# Patient Record
Sex: Female | Born: 1980 | Race: White | Hispanic: No | Marital: Married | Smoking: Former smoker
Health system: Southern US, Community
[De-identification: ages and names within clinical notes are randomized; demographics above are authoritative.]

## PROBLEM LIST (undated history)

## (undated) DIAGNOSIS — E079 Disorder of thyroid, unspecified: Secondary | ICD-10-CM

## (undated) DIAGNOSIS — Z8619 Personal history of other infectious and parasitic diseases: Secondary | ICD-10-CM

## (undated) DIAGNOSIS — K649 Unspecified hemorrhoids: Secondary | ICD-10-CM

## (undated) HISTORY — DX: Personal history of other infectious and parasitic diseases: Z86.19

## (undated) HISTORY — DX: Disorder of thyroid, unspecified: E07.9

## (undated) HISTORY — DX: Unspecified hemorrhoids: K64.9

---

## 2004-10-18 HISTORY — PX: LIPOMA EXCISION: SHX5283

## 2008-03-18 HISTORY — PX: THYROIDECTOMY: SHX17

## 2013-08-02 ENCOUNTER — Encounter: Payer: Self-pay | Admitting: Maternal & Fetal Medicine

## 2013-08-06 ENCOUNTER — Observation Stay: Payer: Self-pay | Admitting: Obstetrics & Gynecology

## 2013-08-06 LAB — URINALYSIS, COMPLETE
Bilirubin,UR: NEGATIVE
Blood: NEGATIVE
Glucose,UR: NEGATIVE mg/dL (ref 0–75)
Leukocyte Esterase: NEGATIVE
Ph: 8 (ref 4.5–8.0)
RBC,UR: NONE SEEN /HPF (ref 0–5)
Squamous Epithelial: 1
WBC UR: NONE SEEN /HPF (ref 0–5)

## 2013-08-19 ENCOUNTER — Encounter: Payer: Self-pay | Admitting: Obstetrics and Gynecology

## 2013-09-02 ENCOUNTER — Encounter: Payer: Self-pay | Admitting: Maternal & Fetal Medicine

## 2013-09-13 ENCOUNTER — Encounter: Payer: Self-pay | Admitting: Obstetrics and Gynecology

## 2013-09-30 ENCOUNTER — Encounter: Payer: Self-pay | Admitting: Obstetrics and Gynecology

## 2013-10-11 ENCOUNTER — Encounter: Payer: Self-pay | Admitting: Obstetrics and Gynecology

## 2013-10-20 ENCOUNTER — Observation Stay: Payer: Self-pay

## 2013-10-27 ENCOUNTER — Observation Stay: Payer: Self-pay

## 2013-10-27 LAB — FETAL FIBRONECTIN
Appearance: NORMAL
Fetal Fibronectin: NEGATIVE

## 2013-10-27 LAB — URINALYSIS, COMPLETE
Glucose,UR: NEGATIVE mg/dL (ref 0–75)
Ketone: NEGATIVE
Nitrite: NEGATIVE
Ph: 8 (ref 4.5–8.0)
Protein: NEGATIVE
WBC UR: <1 /HPF (ref 0–5)

## 2013-10-28 ENCOUNTER — Encounter: Payer: Self-pay | Admitting: Maternal & Fetal Medicine

## 2013-11-04 ENCOUNTER — Encounter: Payer: Self-pay | Admitting: Obstetrics and Gynecology

## 2013-11-08 ENCOUNTER — Encounter: Payer: Self-pay | Admitting: Obstetrics & Gynecology

## 2013-11-15 ENCOUNTER — Encounter: Payer: Self-pay | Admitting: Maternal & Fetal Medicine

## 2013-11-18 HISTORY — PX: TUBAL LIGATION: SHX77

## 2013-11-19 ENCOUNTER — Observation Stay: Payer: Self-pay | Admitting: Obstetrics and Gynecology

## 2013-11-22 ENCOUNTER — Encounter: Payer: Self-pay | Admitting: Obstetrics and Gynecology

## 2013-11-25 ENCOUNTER — Inpatient Hospital Stay: Payer: Self-pay | Admitting: Obstetrics and Gynecology

## 2013-11-25 LAB — CBC WITH DIFFERENTIAL/PLATELET
BASOS PCT: 0.3 %
Basophil #: 0 10*3/uL (ref 0.0–0.1)
EOS PCT: 0.3 %
Eosinophil #: 0 10*3/uL (ref 0.0–0.7)
HCT: 35.8 % (ref 35.0–47.0)
HGB: 12 g/dL (ref 12.0–16.0)
Lymphocyte #: 1 10*3/uL (ref 1.0–3.6)
Lymphocyte %: 12.4 %
MCH: 28.5 pg (ref 26.0–34.0)
MCHC: 33.5 g/dL (ref 32.0–36.0)
MCV: 85 fL (ref 80–100)
Monocyte #: 0.6 x10 3/mm (ref 0.2–0.9)
Monocyte %: 7.1 %
Neutrophil #: 6.4 10*3/uL (ref 1.4–6.5)
Neutrophil %: 79.9 %
Platelet: 152 10*3/uL (ref 150–440)
RBC: 4.22 10*6/uL (ref 3.80–5.20)
RDW: 15.1 % — ABNORMAL HIGH (ref 11.5–14.5)
WBC: 8 10*3/uL (ref 3.6–11.0)

## 2013-11-26 LAB — HEMATOCRIT: HCT: 35.2 % (ref 35.0–47.0)

## 2013-11-29 LAB — PATHOLOGY REPORT

## 2013-12-02 LAB — PATHOLOGY REPORT

## 2014-07-08 ENCOUNTER — Ambulatory Visit: Payer: Self-pay | Admitting: Internal Medicine

## 2014-08-24 IMAGING — US US OB FOLLOW-UP - NRPT [facility]
1 series · 14 of 28 positions shown · non-contrast
Comparison: none

[Series 1: us ob follow-up - nrpt [facility] · 0.30mm/px · 14 of 78 slices shown]
[im 3/78]
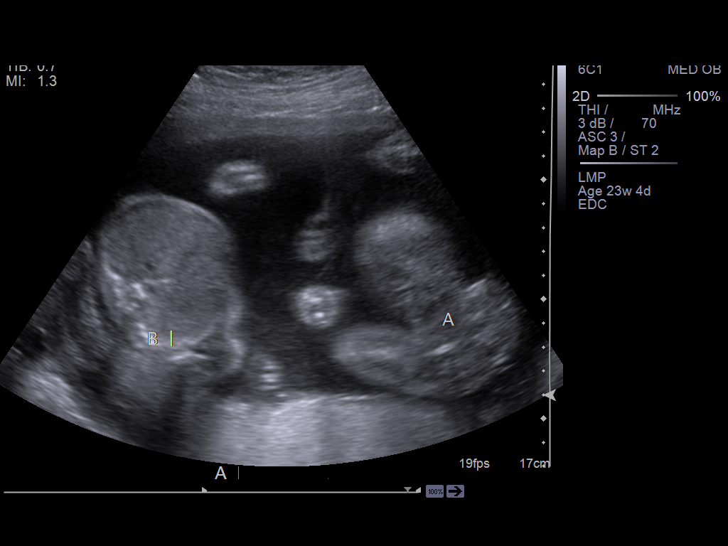
[im 9/78]
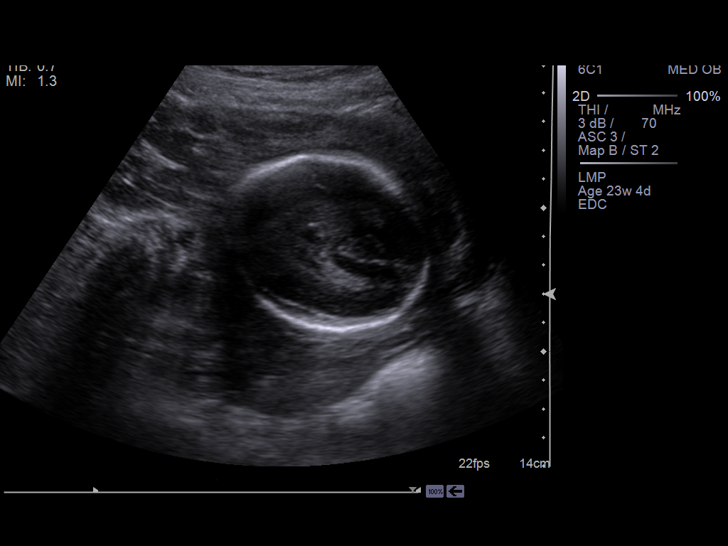
[im 15/78]
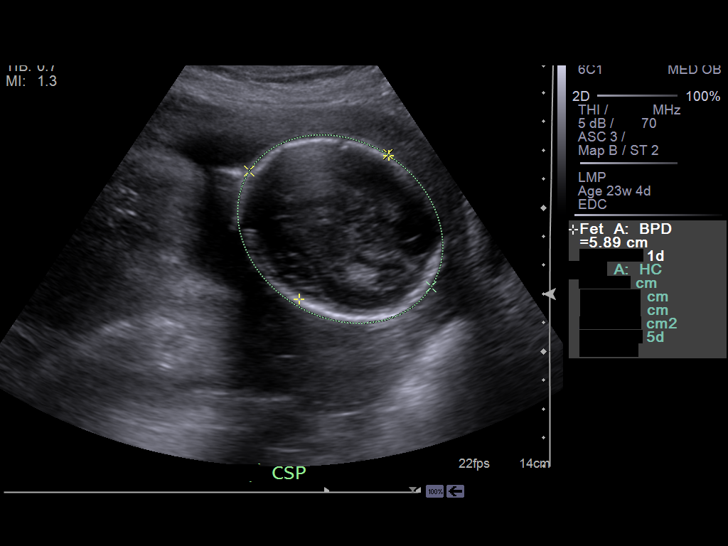
[im 20/78]
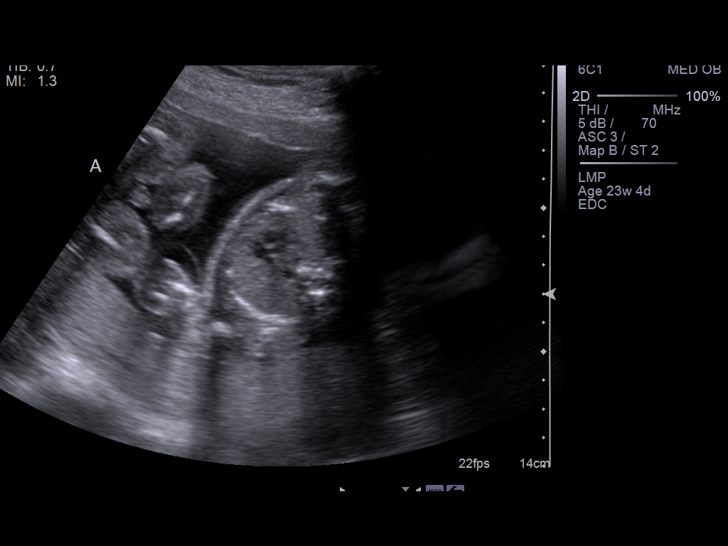
[im 26/78]
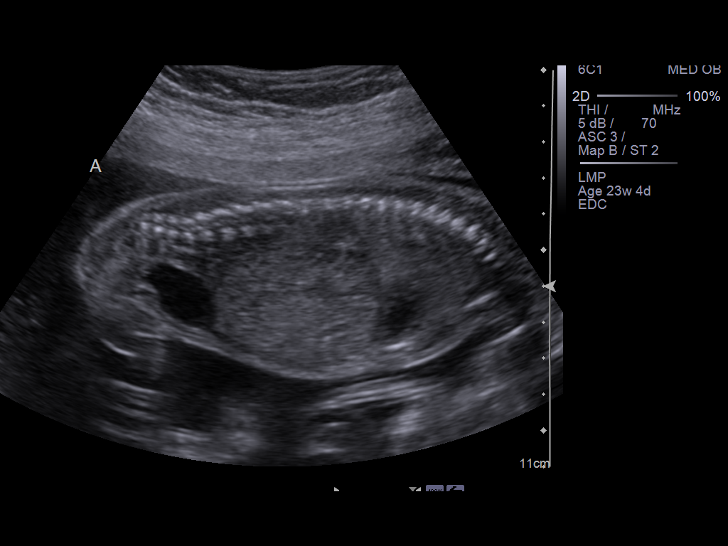
[im 32/78]
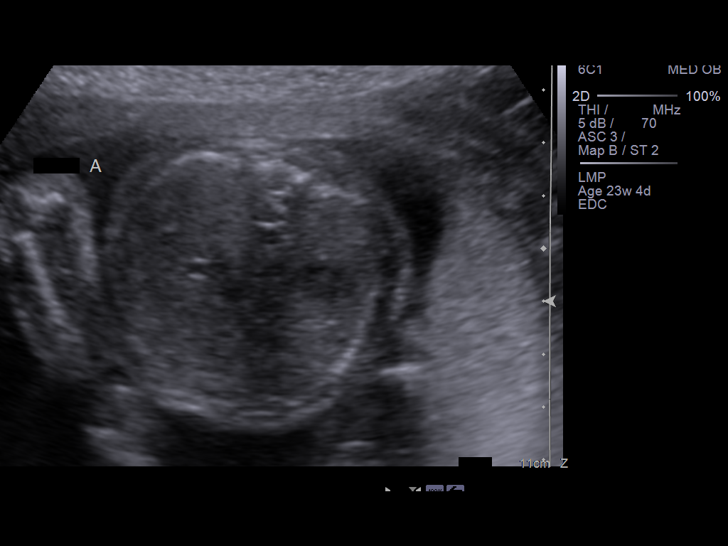
[im 38/78]
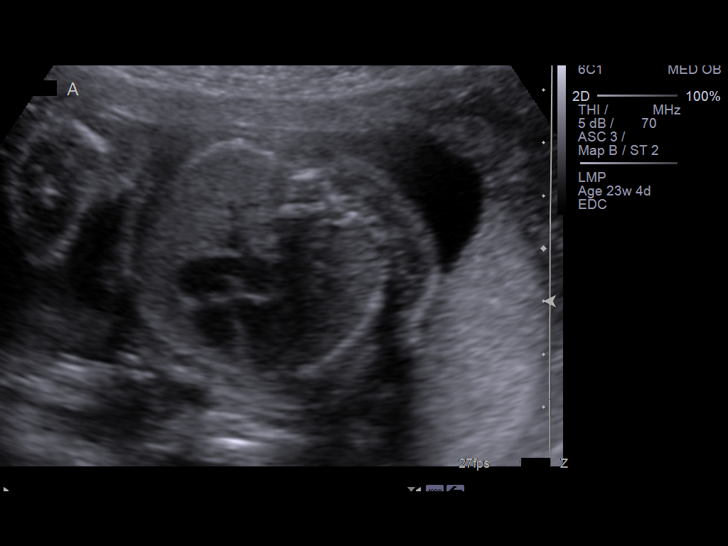
[im 43/78]
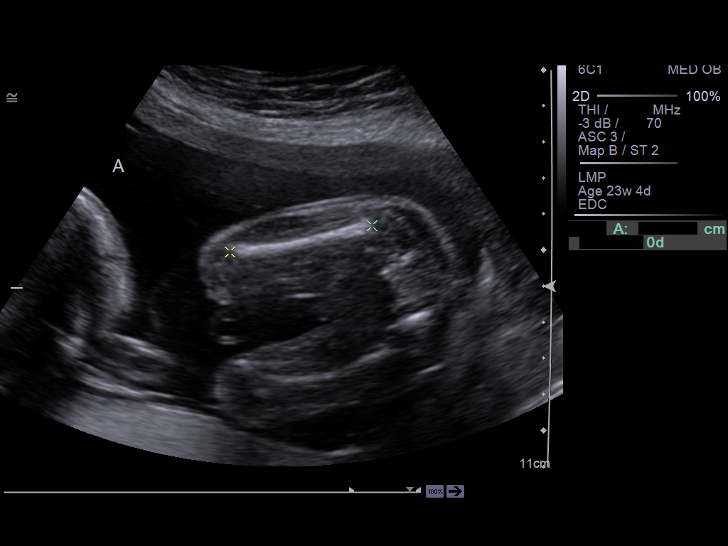
[im 49/78]
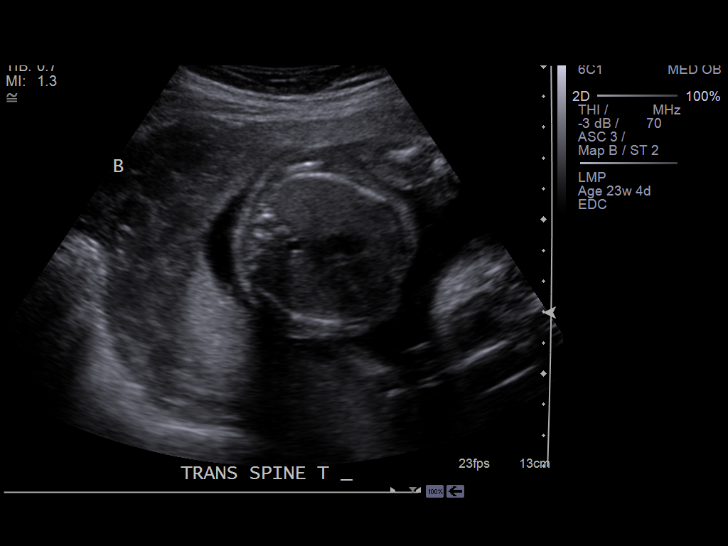
[im 55/78]
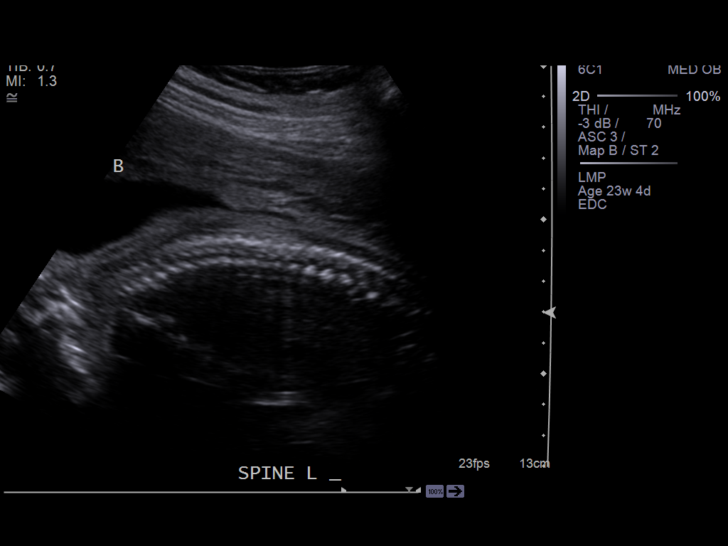
[im 60/78]
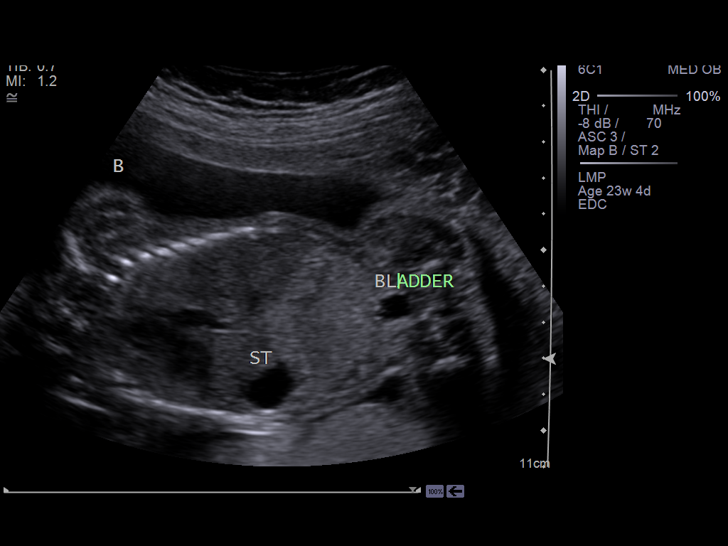
[im 66/78]
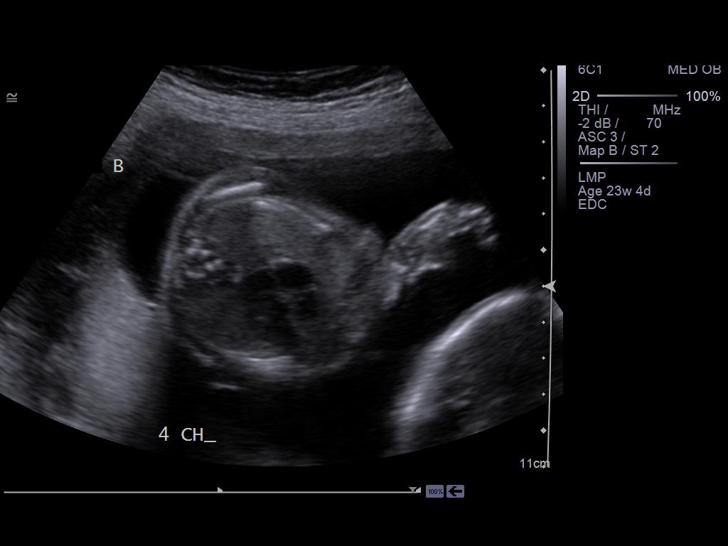
[im 72/78]
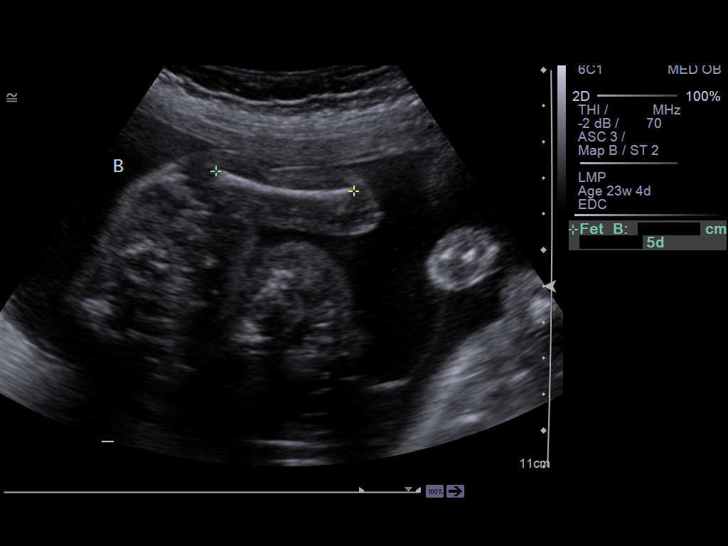
[im 78/78]
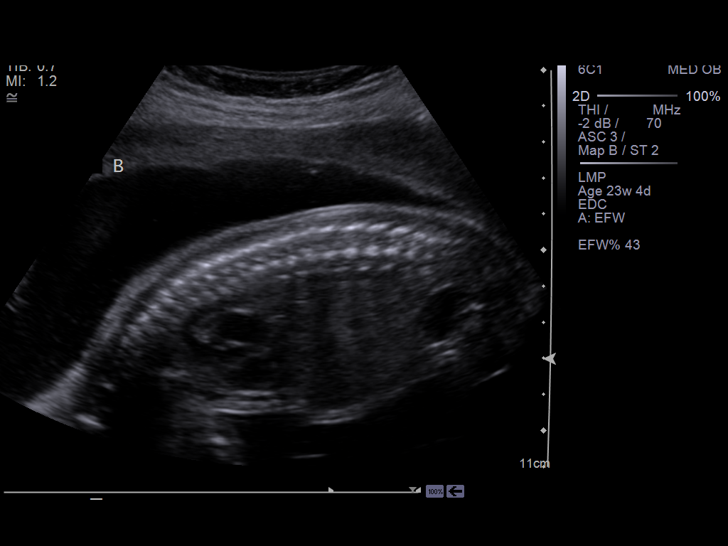

[14 of 28 positions shown; findings below may reference images not displayed]

IMAGES IMPORTED FROM THE SYNGO WORKFLOW SYSTEM
NO DICTATION FOR STUDY

## 2014-09-27 IMAGING — US US FETAL BPP W/O NON-STRESS - NRPT
1 series · 13 of 13 positions shown · non-contrast
Comparison: none

[Series 1: us fetal bpp w/o non-stress - nrpt · 0.31mm/px · 13 of 13 slices shown]
[im 1/13]
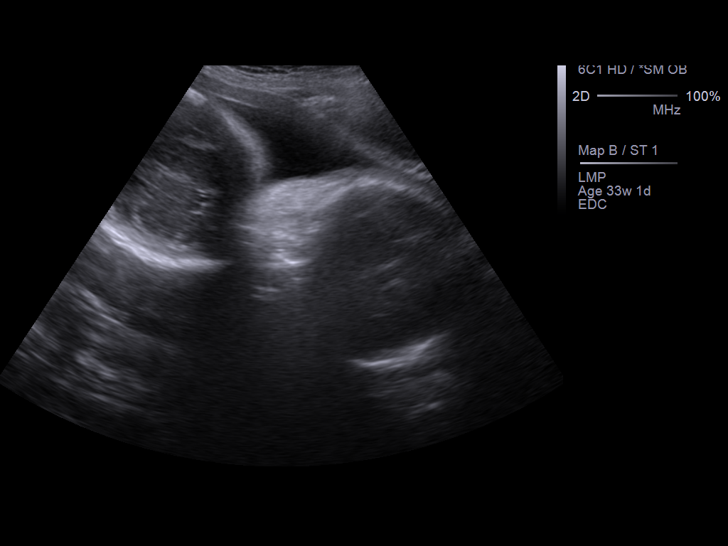
[im 2/13]
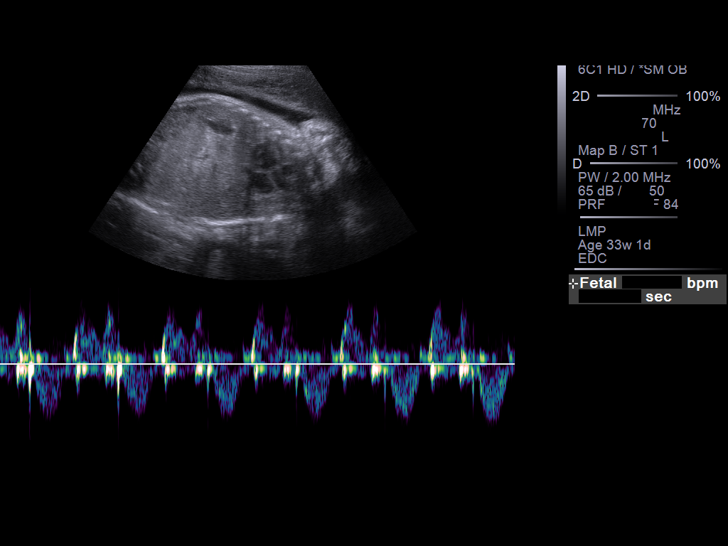
[im 3/13]
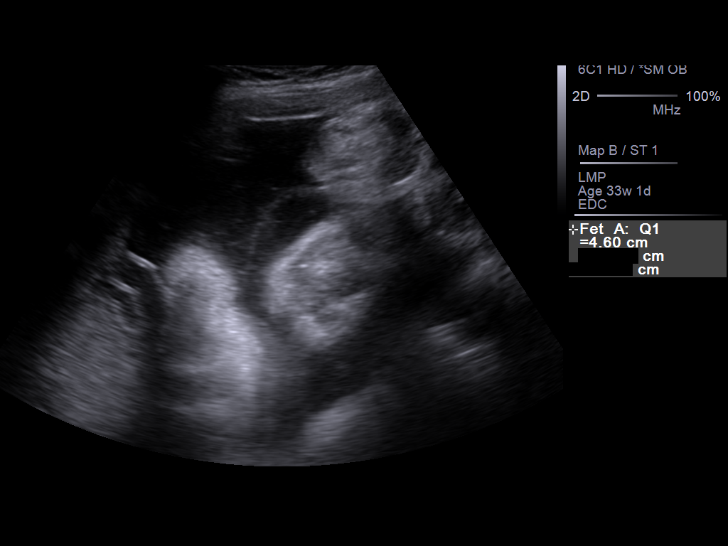
[im 4/13]
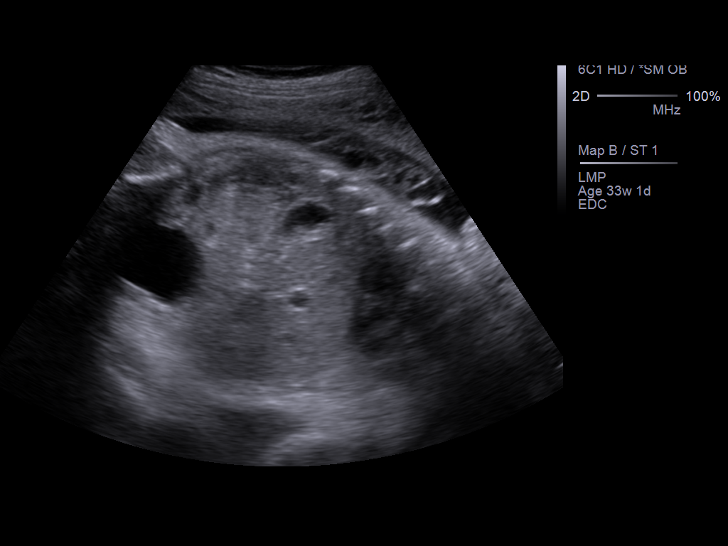
[im 5/13]
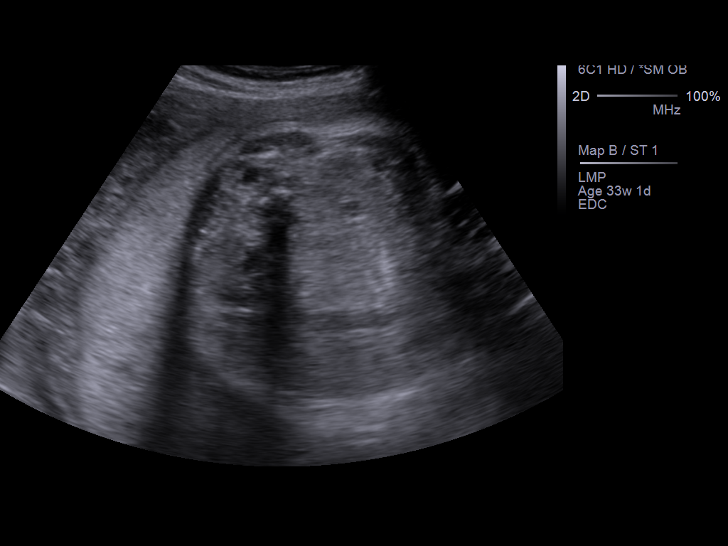
[im 6/13]
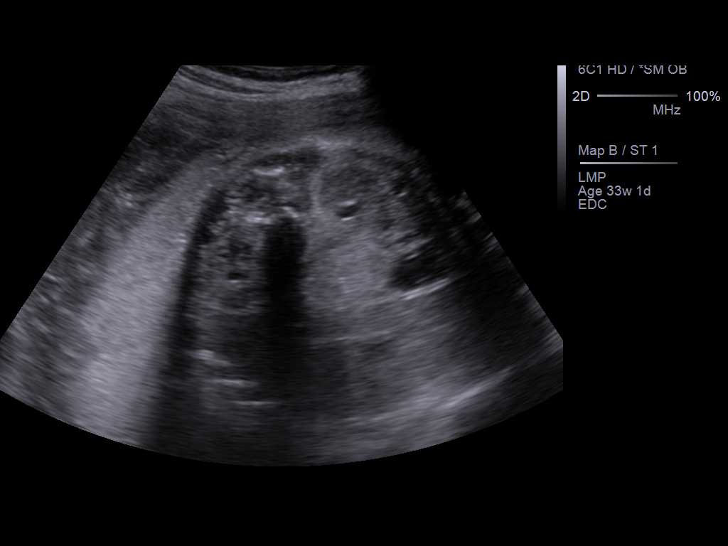
[im 7/13]
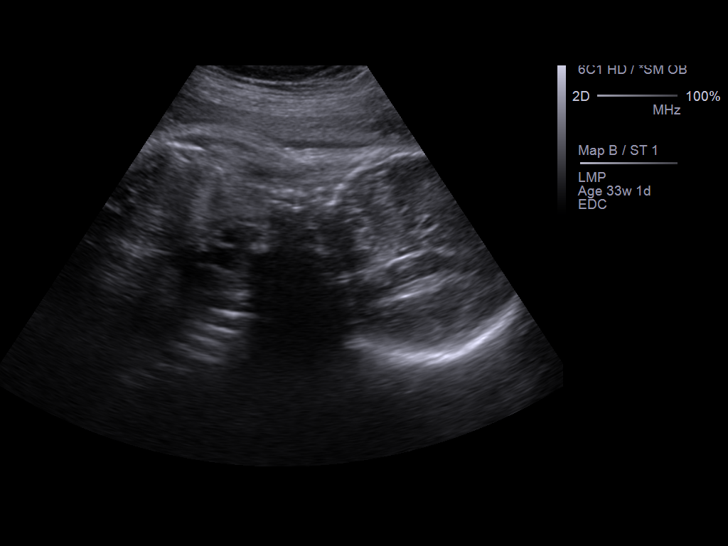
[im 8/13]
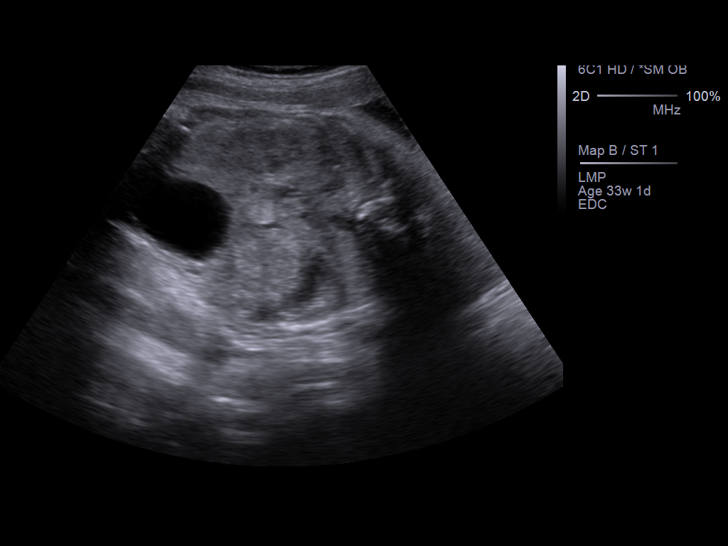
[im 9/13]
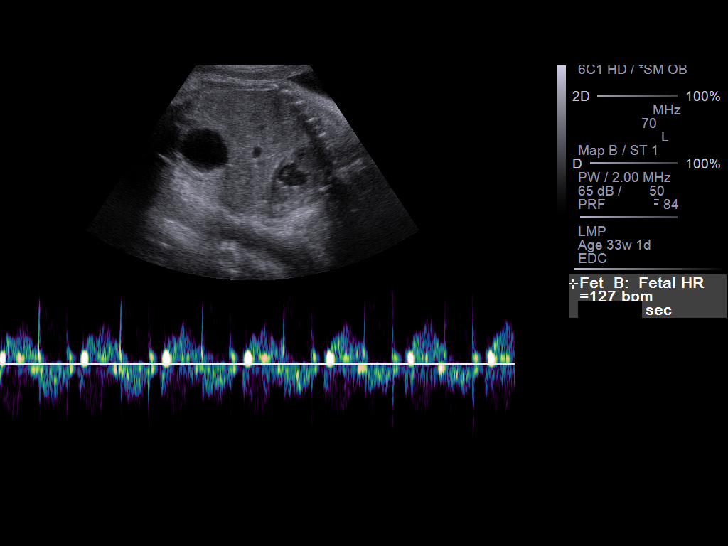
[im 10/13]
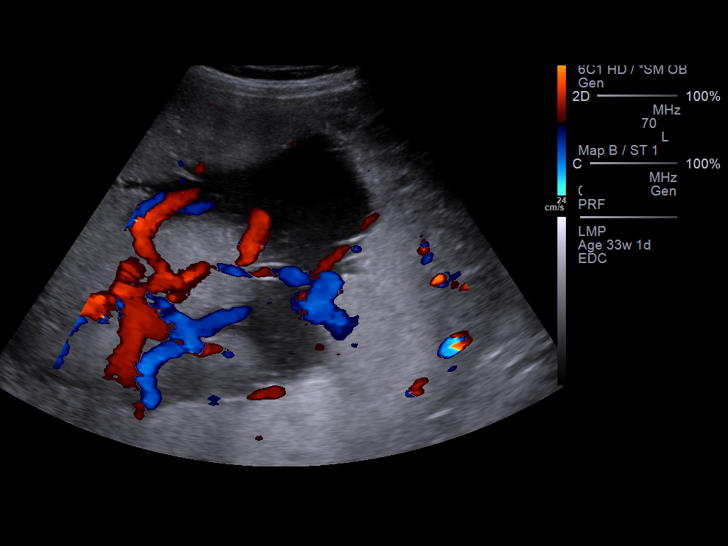
[im 11/13]
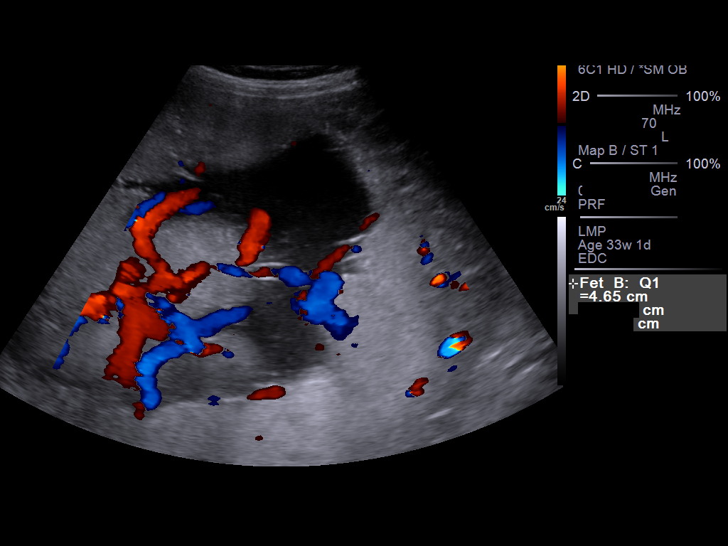
[im 12/13]
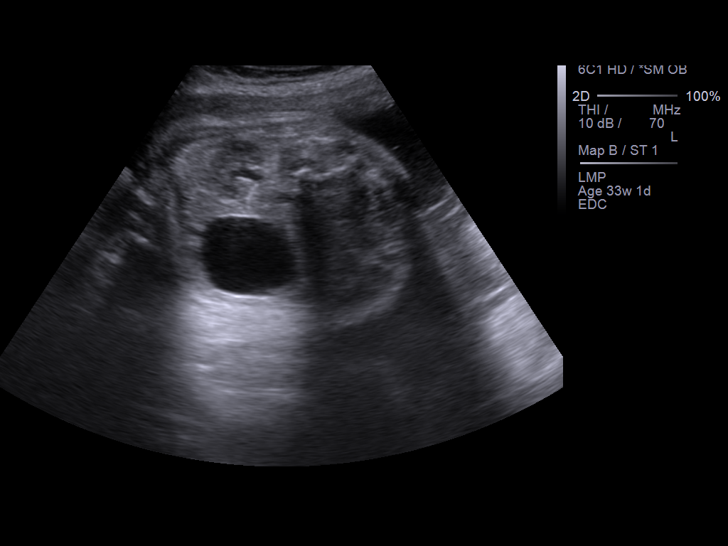
[im 13/13]
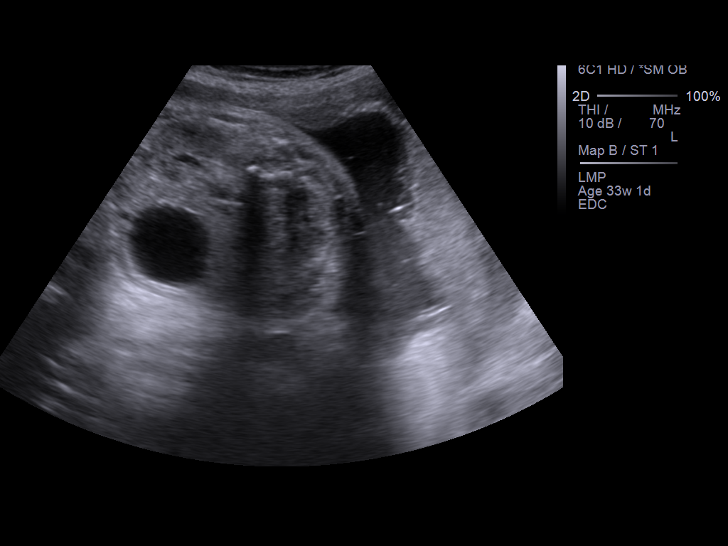

[13 of 13 positions shown; findings below may reference images not displayed]

IMAGES IMPORTED FROM THE SYNGO WORKFLOW SYSTEM
NO DICTATION FOR STUDY

## 2014-10-01 IMAGING — US US OB FOLLOW-UP - NRPT [facility]
1 series · 14 of 28 positions shown · non-contrast
Comparison: none

[Series 1: us ob follow-up - nrpt [facility] · 0.26mm/px · 14 of 91 slices shown]
[im 4/91]
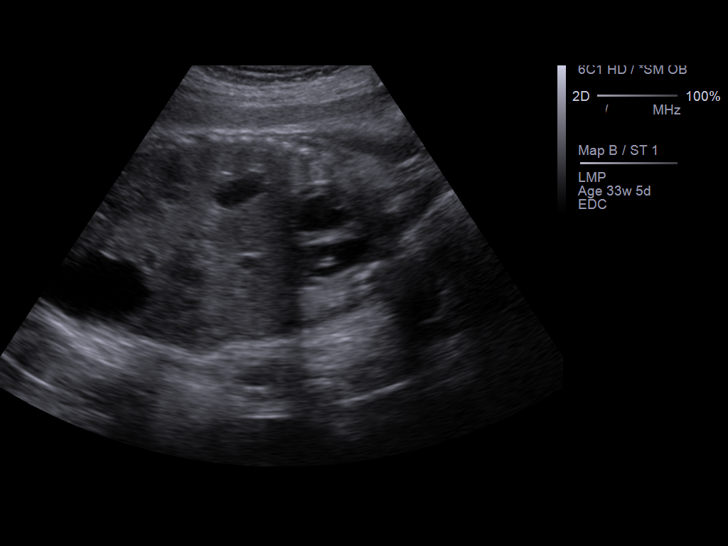
[im 11/91]
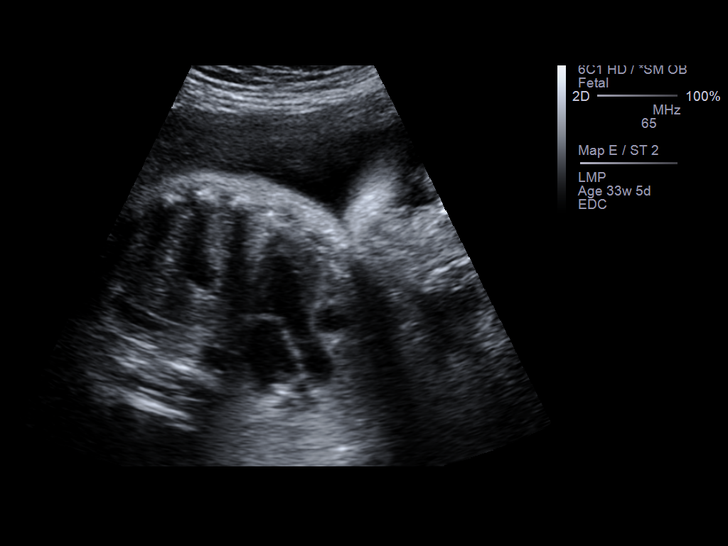
[im 17/91]
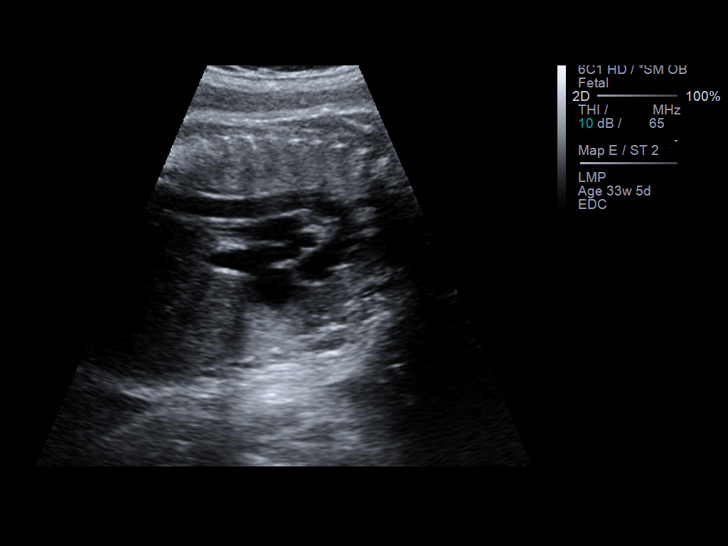
[im 24/91]
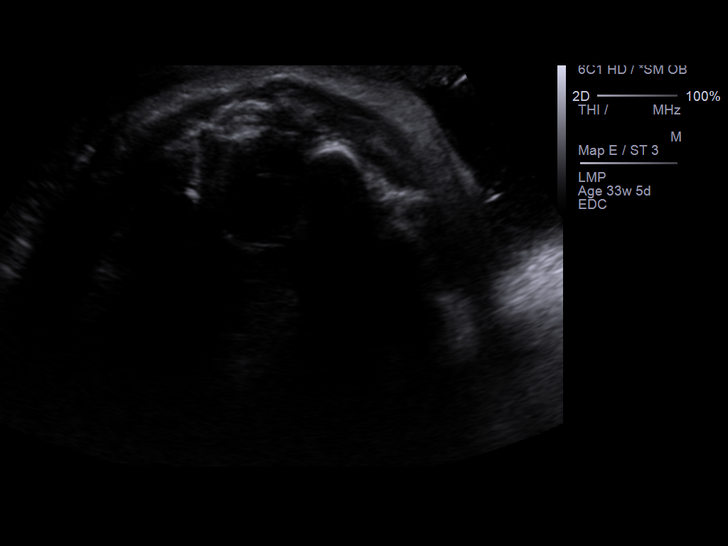
[im 31/91]
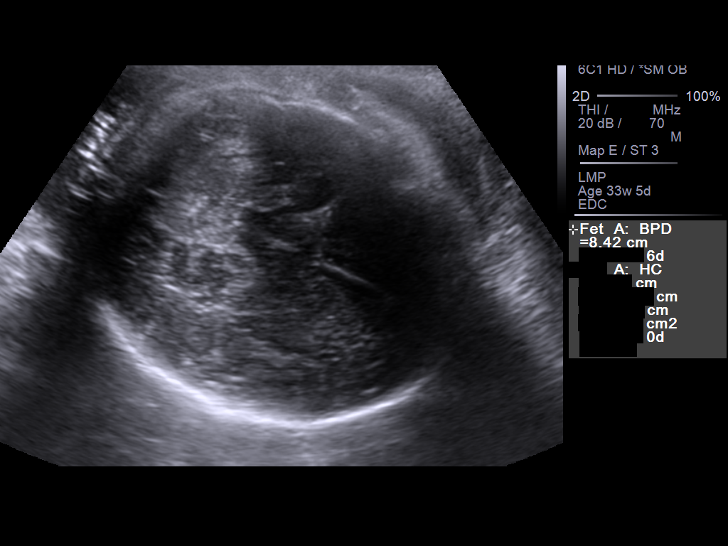
[im 37/91]
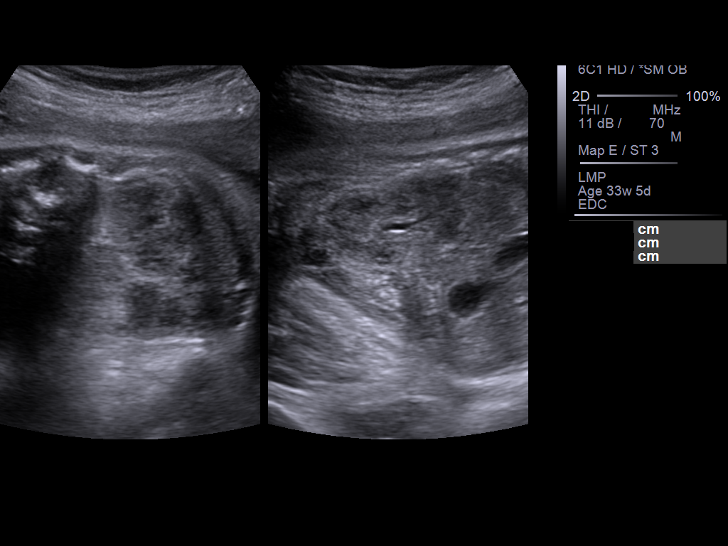
[im 44/91]
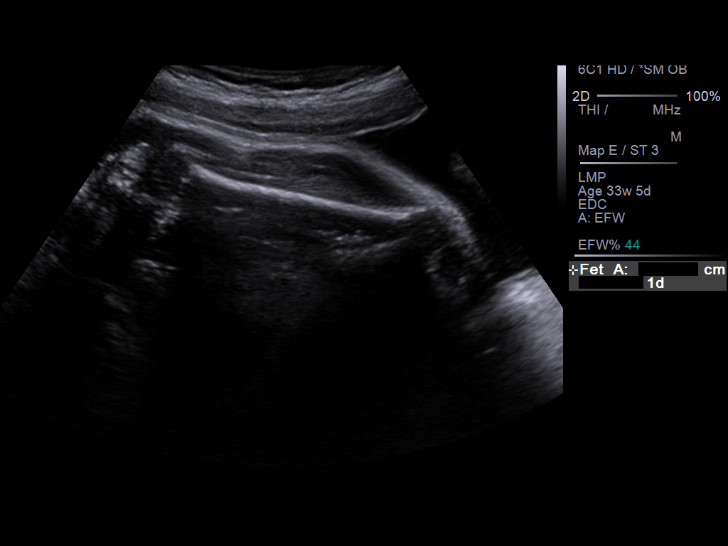
[im 51/91]
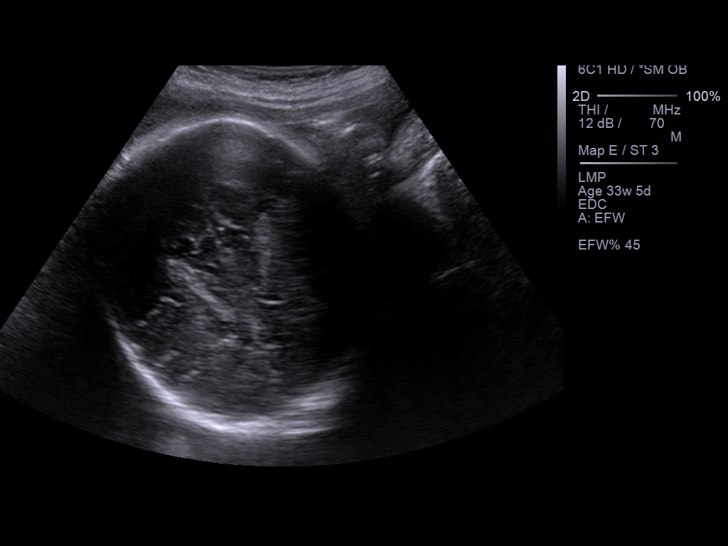
[im 57/91]
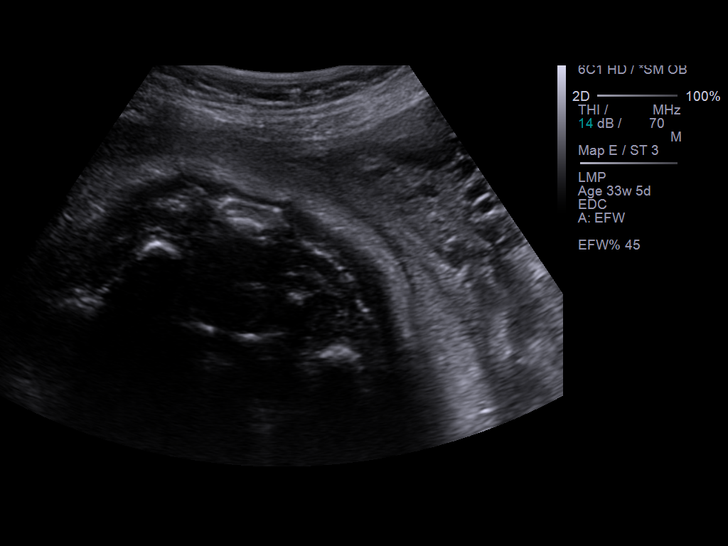
[im 64/91]
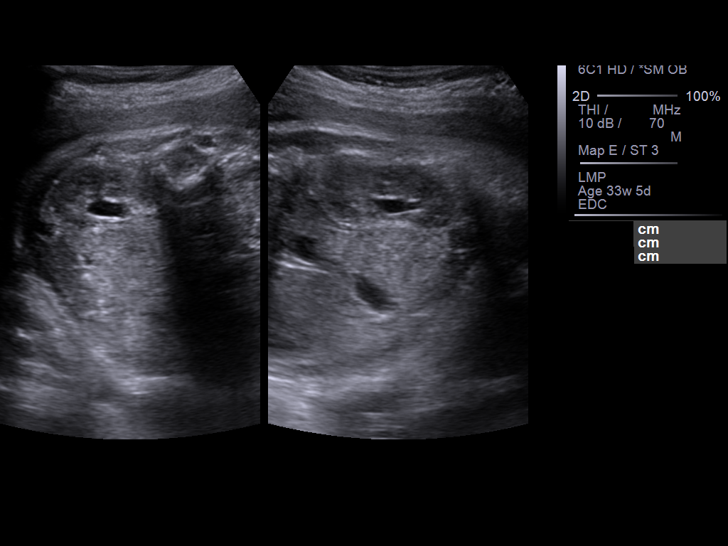
[im 71/91]
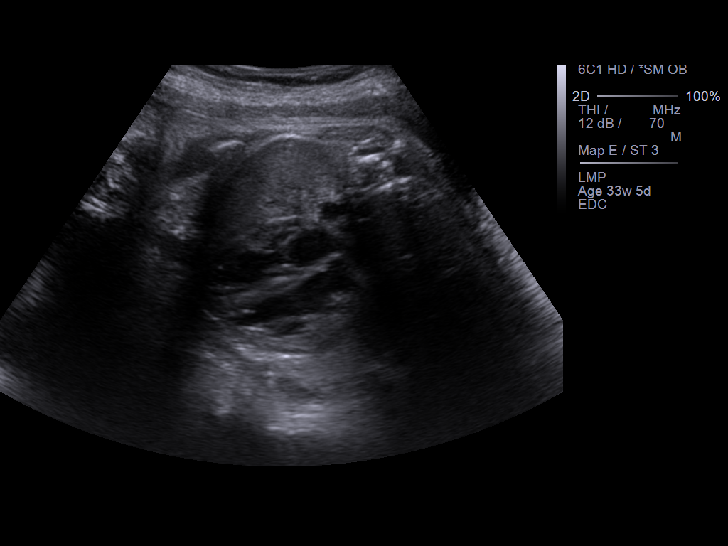
[im 77/91]
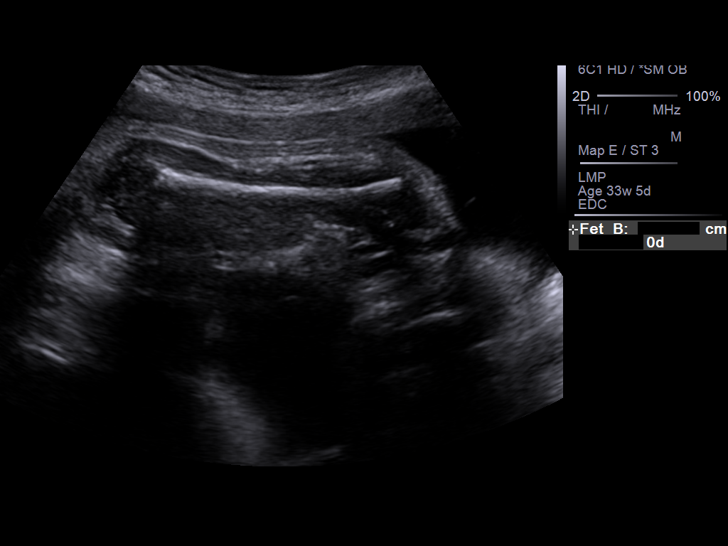
[im 84/91]
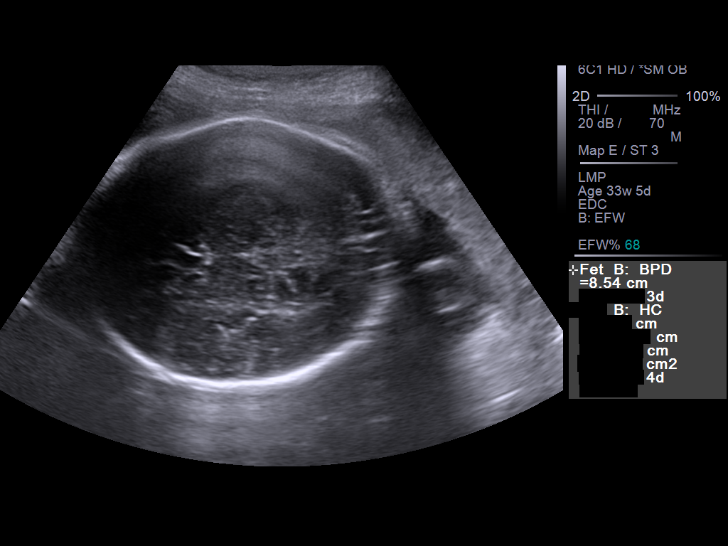
[im 91/91]
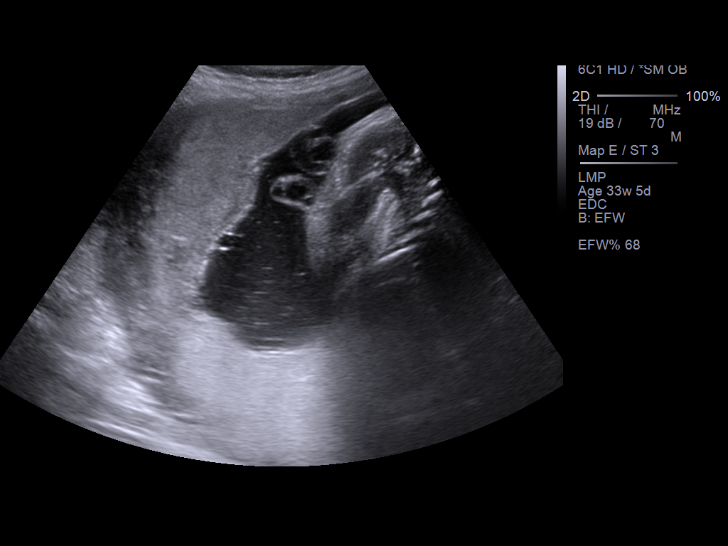

[14 of 28 positions shown; findings below may reference images not displayed]

IMAGES IMPORTED FROM THE SYNGO WORKFLOW SYSTEM
NO DICTATION FOR STUDY

## 2015-01-09 ENCOUNTER — Encounter: Payer: Self-pay | Admitting: *Deleted

## 2015-01-18 ENCOUNTER — Other Ambulatory Visit: Payer: 59

## 2015-01-18 ENCOUNTER — Encounter: Payer: Self-pay | Admitting: General Surgery

## 2015-01-18 ENCOUNTER — Ambulatory Visit (INDEPENDENT_AMBULATORY_CARE_PROVIDER_SITE_OTHER): Payer: 59 | Admitting: General Surgery

## 2015-01-18 VITALS — BP 110/74 | Ht 67.0 in | Wt 179.0 lb

## 2015-01-18 DIAGNOSIS — R1909 Other intra-abdominal and pelvic swelling, mass and lump: Secondary | ICD-10-CM

## 2015-01-18 NOTE — Patient Instructions (Addendum)
The patient is aware to call back for any questions or concerns. excision of the left groin nodule at same day surgery  Patient's surgery has been [facility].

## 2015-01-18 NOTE — Progress Notes (Signed)
Patient ID: Jaclyn Brandt, female   DOB: May 12, 1981, 34 y.o.   MRN: [id number]  Chief Complaint  Patient presents with  . Other    evaluation for left inguinal lymphadenopathy    HPI Jaclyn Brandt is a 34 y.o. female.  Here today for evaluation of left inguinal adenopathy. She states she noticed a knot in the left groin about two years ago (while living in [place]  She states the knot did go away for about 3-6 months (while pregnant with twins) but came back and has not gone away. She states it has gotten larger since early fall. She states the area is tender and "umcomfortable" worse with bending. She has a dog in the house but no cats. She denies any scratches or trauma to that area.  Children 8,7 and twins 8916 months old.  HPI  Past Medical History  Diagnosis Date  . Thyroid disease   . Hemorrhoids   . History of HPV infection     Past Surgical History  Procedure Laterality Date  . Lipoma excision Left 10/2004    left calf   . Thyroidectomy  03/2008  . Tubal ligation  11/2013    Family History  Problem Relation Age of Onset  . Cancer Paternal Aunt     great aunt/breast  . Cancer Maternal Grandmother     breast    Social History History  Substance Use Topics  . Smoking status: Former Smoker -- 5 years  . Smokeless tobacco: Never Used  . Alcohol Use: Yes    No Known Allergies  Current Outpatient Prescriptions  Medication Sig Dispense Refill  . levothyroxine (SYNTHROID, LEVOTHROID) 150 MCG tablet Take 150 mcg by mouth daily before breakfast.     No current facility-administered medications for this visit.    Review of Systems Review of Systems  Constitutional: Negative.   Respiratory: Negative.   Cardiovascular: Negative.   Gastrointestinal: Positive for constipation. Negative for diarrhea and blood in stool.    Blood pressure 110/74, height 5\' 7"  (1.702 m), weight 179 lb (81.194 kg), last menstrual period 01/08/2015.  Physical Exam Physical Exam   Constitutional: She appears well-developed and well-nourished.  Neck: Neck supple.  Cardiovascular: Normal rate, regular rhythm and normal heart sounds.   Pulses:      Femoral pulses are 2+ on the right side, and 2+ on the left side. No lower leg edema.  Pulmonary/Chest: Effort normal and breath sounds normal.  Abdominal: Soft. Normal appearance. There is no hepatomegaly. There is no tenderness.    Lymphadenopathy:    She has no cervical adenopathy.    She has no axillary adenopathy.       Left: Inguinal adenopathy present.  2.5 cm smooth nodule left groin.  Neurological: She is alert.  Skin: Skin is warm and dry.    Data Reviewed Ultrasound examination of the palpable mass in left groin was completed. There is a 1.3 x 2.9 x 3.2 cm isoechoic mass extending to just below the dermis but opened above the level of the underlying fascia. This may well represent a lipoma. Contained within this is an isoechoic area without blood.  2 small lymph nodes measuring 0.5 x 1.0 and 0.6 x 0.8 x 0.83 cm or noted adjacent to this area. The visualized femoral vessels are normal.  Assessment    Left inguinal mass, likely lipoma, unlikely femoral hernia.    Plan    Due to the report of its enlargement as well as local discomfort with flexion  of the hip arrangements were made for excision. The risks of surgery including bleeding, infection and seroma formation were reviewed.     Discussed risk and benefits of excision of the left groin nodule.  Patient's surgery has been scheduled for 01-25-15 at [facility].  PCP:  None REF: Dr. Alvis Lemmings, Merrily Pew 01/19/2015, 8:38 PM

## 2015-01-19 ENCOUNTER — Other Ambulatory Visit: Payer: Self-pay | Admitting: General Surgery

## 2015-01-19 DIAGNOSIS — R1909 Other intra-abdominal and pelvic swelling, mass and lump: Secondary | ICD-10-CM | POA: Insufficient documentation

## 2015-01-24 ENCOUNTER — Ambulatory Visit (INDEPENDENT_AMBULATORY_CARE_PROVIDER_SITE_OTHER): Payer: 59 | Admitting: Primary Care

## 2015-01-24 ENCOUNTER — Encounter: Payer: Self-pay | Admitting: Primary Care

## 2015-01-24 ENCOUNTER — Telehealth: Payer: Self-pay | Admitting: Primary Care

## 2015-01-24 VITALS — BP 110/74 | HR 68 | Temp 98.6°F | Ht 67.0 in | Wt 176.8 lb

## 2015-01-24 DIAGNOSIS — R1909 Other intra-abdominal and pelvic swelling, mass and lump: Secondary | ICD-10-CM

## 2015-01-24 DIAGNOSIS — E039 Hypothyroidism, unspecified: Secondary | ICD-10-CM | POA: Insufficient documentation

## 2015-01-24 DIAGNOSIS — E890 Postprocedural hypothyroidism: Secondary | ICD-10-CM

## 2015-01-24 NOTE — Progress Notes (Signed)
Pre visit review using our clinic review tool, if applicable. No additional management support is needed unless otherwise documented below in the visit note. 

## 2015-01-24 NOTE — Telephone Encounter (Signed)
Spoke to pt and explained that she was being seen as a new pt to establish care at our practice.  She does not have a cardiologist or another PCP. She stated she was not sure when her surgery was scheduled for tomorrow 01/26/15.  I explained that it was up to the provider whether or not to give surgical clearance.  She understands that she may not receive clearance today.  Again explained that was provider discretion.

## 2015-01-24 NOTE — Assessment & Plan Note (Signed)
Surgery scheduled for 01/25/15. Would like to get genetic testing for Von Hippel Lindau. Will consider referral to geneticist, she is to speak with her surgeon regarding this first.

## 2015-01-24 NOTE — Progress Notes (Signed)
Subjective:    Patient ID: Jaclyn Brandt, female    DOB: 1981-06-28, 33 y.o.   MRN: [id number]  HPI  Jaclyn Brandt is a 34 year old female who presents today to establish care.  1) Hypothyroidism: She had her thyroid removed in 2009 and has been on Levothyroxine. She went for a period of about one year without medication and was restarted at by her OB/GYN 2 weeks ago. She denies palpitations, weakness, constipation.  2) Lipoma to left groin: Has surgery scheduled for removal of lipoma tomorrow. She's had several lipomas removed before which have all resulted as benign. She would like to be tested for von hippel lindau disease due to distant family member having the disease which caused debilitating quality of life.   3) Family history of breast cancer: Paternal grandmother was diagnosed at an unknown age and had double mastectomy. Her mother had had no known breast cancer but also has not been screened. She would like to be set up for a screening mammogram. Denies changes in breasts, denies moveable or firm masses. Had cyst 2 years ago which was benign.    Review of Systems  Constitutional: Negative for fever.  HENT:       Seasonal allergies  Respiratory: Negative for cough and shortness of breath.   Cardiovascular: Negative for chest pain.  Gastrointestinal: Negative for diarrhea and constipation.  Genitourinary: Negative for dysuria and frequency.  Musculoskeletal:       Occasional joint pain to neck, shoulder, knee  Skin: Negative for rash.  Neurological: Negative for headaches.  Psychiatric/Behavioral:       Denies concerns of anxiety/depression.       Past Medical History  Diagnosis Date  . Thyroid disease   . Hemorrhoids   . History of HPV infection     History   Social History  . Marital Status: Married    Spouse Name: N/A  . Number of Children: N/A  . Years of Education: N/A   Occupational History  . Not on file.   Social History Main Topics  .  Smoking status: Former Smoker -- 5 years  . Smokeless tobacco: Never Used  . Alcohol Use: 0.0 oz/week    0 Standard drinks or equivalent per week  . Drug Use: No  . Sexual Activity: Not on file   Other Topics Concern  . Not on file   Social History Narrative   Married   Has 4 children, (3 boys 7 years and 14 months), 78 girl- 33 years old   Live in [place]   Enjoys going out with her husband, hiking.    Past Surgical History  Procedure Laterality Date  . Lipoma excision Left 10/2004    left calf   . Thyroidectomy  03/2008  . Tubal ligation  11/2013    Family History  Problem Relation Age of Onset  . Heart disease Paternal Grandfather     great aunt/breast  . Cancer Paternal Grandmother     breast  . Bipolar disorder Maternal Grandfather   . Hyperlipidemia Father     No Known Allergies  Current Outpatient Prescriptions on File Prior to Visit  Medication Sig Dispense Refill  . levothyroxine (SYNTHROID, LEVOTHROID) 150 MCG tablet Take 150 mcg by mouth daily before breakfast.     No current facility-administered medications on file prior to visit.    BP 110/74 mmHg  Pulse 68  Temp(Src) 98.6 F (37 C) (Oral)  Ht  (1.702 m)  Wt 176  lb 12 oz (80.173 kg)  BMI 27.68 kg/m2  LMP 01/08/2015    Objective:   Physical Exam  Constitutional: She is oriented to person, place, and time. She appears well-developed.  HENT:  Head: Normocephalic.  Right Ear: External ear normal.  Left Ear: External ear normal.  Mouth/Throat: Oropharynx is clear and moist.  Eyes: Conjunctivae and EOM are normal. Pupils are equal, round, and reactive to light.  Neck: Neck supple.  Cardiovascular: Normal rate and regular rhythm.   Pulmonary/Chest: Effort normal and breath sounds normal.  Abdominal: Soft. Bowel sounds are normal. There is no tenderness.  Genitourinary: No breast swelling, tenderness or discharge.  Lymphadenopathy:    She has no cervical adenopathy.  Neurological: She is  alert and oriented to person, place, and time.  Skin: Skin is warm and dry.  Psychiatric: She has a normal mood and affect.          Assessment & Plan:

## 2015-01-24 NOTE — Patient Instructions (Signed)
Let me know what your insurance says regarding the screening mammogram. Follow up in 6 weeks for fasting physical. Welcome to [facility], it was a pleasure meeting you!

## 2015-01-24 NOTE — Assessment & Plan Note (Signed)
Thyroid removed in 2009. Currently re-started on 150mcg 2 weeks ago per OB/GYN. Will recheck at next visit.

## 2015-01-25 ENCOUNTER — Ambulatory Visit: Payer: Self-pay | Admitting: General Surgery

## 2015-01-25 DIAGNOSIS — K4190 Unilateral femoral hernia, without obstruction or gangrene, not specified as recurrent: Secondary | ICD-10-CM | POA: Diagnosis not present

## 2015-01-25 HISTORY — PX: HERNIA REPAIR: SHX51

## 2015-01-26 ENCOUNTER — Encounter: Payer: Self-pay | Admitting: General Surgery

## 2015-01-31 ENCOUNTER — Ambulatory Visit: Payer: 59 | Admitting: Primary Care

## 2015-02-01 ENCOUNTER — Encounter: Payer: Self-pay | Admitting: General Surgery

## 2015-02-01 ENCOUNTER — Ambulatory Visit (INDEPENDENT_AMBULATORY_CARE_PROVIDER_SITE_OTHER): Payer: 59 | Admitting: General Surgery

## 2015-02-01 DIAGNOSIS — K469 Unspecified abdominal hernia without obstruction or gangrene: Secondary | ICD-10-CM | POA: Insufficient documentation

## 2015-02-01 DIAGNOSIS — K4190 Unilateral femoral hernia, without obstruction or gangrene, not specified as recurrent: Secondary | ICD-10-CM

## 2015-02-01 NOTE — Patient Instructions (Addendum)
Activity as tolerated. Use proper lifting. Use heating pad as needed for discomfort.

## 2015-02-01 NOTE — Progress Notes (Signed)
Patient ID: Jaclyn Brandt, female   DOB: [birth year], 34 y.o.   MRN: [id number]  Chief Complaint  Patient presents with  . Follow-up    left groin excision    HPI Jaclyn DaysCassandra Maland is a 34 y.o. female  Here for a follow up of a left groin mass that turned out to be a femoral hernia completed on 01/25/15. She is doing well and not taking any pain prescription medication. She states that she had a lot of pain on Friday but that has since lessoned. She still has some soreness with going up and down stairs. She reports no drainage or redness.  HPI  Past Medical History  Diagnosis Date  . Thyroid disease   . Hemorrhoids   . History of HPV infection     Past Surgical History  Procedure Laterality Date  . Lipoma excision Left 10/2004    left calf   . Thyroidectomy  03/2008  . Tubal ligation  11/2013  . Hernia repair Left 01/25/2015    Primary repair left femoral hernia.    Family History  Problem Relation Age of Onset  . Heart disease Paternal Grandfather     great aunt/breast  . Cancer Paternal Grandmother     breast  . Bipolar disorder Maternal Grandfather   . Hyperlipidemia Father     Social History History  Substance Use Topics  . Smoking status: Former Smoker -- 5 years  . Smokeless tobacco: Never Used  . Alcohol Use: 0.0 oz/week    0 Standard drinks or equivalent per week    No Known Allergies  Current Outpatient Prescriptions  Medication Sig Dispense Refill  . levothyroxine (SYNTHROID, LEVOTHROID) 150 MCG tablet Take 150 mcg by mouth daily before breakfast.     No current facility-administered medications for this visit.    Review of Systems Review of Systems  Constitutional: Negative.   Respiratory: Negative.   Cardiovascular: Negative.     Blood pressure 110/68, pulse 74, resp. rate 14, height 5\' 7"  (1.702 m), weight 176 lb (79.833 kg), last menstrual period 01/08/2015.  Physical Exam Physical Exam  Constitutional: She is oriented to person, place, and  time. She appears well-developed and well-nourished.  Neck: Neck supple.  Cardiovascular: Normal rate, regular rhythm and normal heart sounds.   Pulmonary/Chest: Effort normal and breath sounds normal.  Abdominal: Soft. Bowel sounds are normal.    Lymphadenopathy:    She has no cervical adenopathy.  Neurological: She is alert and oriented to person, place, and time.      Assessment    Doing well status post femoral hernia repair.    Plan    Proper lifting techniques reviewed. She is released to full activity. She was encouraged to call if any questions develop.    PCP: None REF: Dr. Alvis LemmingsHarris   Manda Holstad, Merrily PewJeffrey W 02/01/2015, 9:47 AM

## 2015-02-28 ENCOUNTER — Other Ambulatory Visit (INDEPENDENT_AMBULATORY_CARE_PROVIDER_SITE_OTHER): Payer: 59

## 2015-02-28 DIAGNOSIS — E039 Hypothyroidism, unspecified: Secondary | ICD-10-CM

## 2015-02-28 DIAGNOSIS — Z0000 Encounter for general adult medical examination without abnormal findings: Secondary | ICD-10-CM | POA: Diagnosis not present

## 2015-02-28 LAB — CBC WITH DIFFERENTIAL/PLATELET
BASOS ABS: 0 10*3/uL (ref 0.0–0.1)
Basophils Relative: 0.4 % (ref 0.0–3.0)
Eosinophils Absolute: 0.1 10*3/uL (ref 0.0–0.7)
Eosinophils Relative: 1.3 % (ref 0.0–5.0)
HCT: 40.4 % (ref 36.0–46.0)
Hemoglobin: 13.8 g/dL (ref 12.0–15.0)
Lymphocytes Relative: 25.3 % (ref 12.0–46.0)
Lymphs Abs: 1.5 10*3/uL (ref 0.7–4.0)
MCHC: 34.3 g/dL (ref 30.0–36.0)
MCV: 87.3 fl (ref 78.0–100.0)
Monocytes Absolute: 0.4 10*3/uL (ref 0.1–1.0)
Monocytes Relative: 6.4 % (ref 3.0–12.0)
Neutro Abs: 3.9 10*3/uL (ref 1.4–7.7)
Neutrophils Relative %: 66.6 % (ref 43.0–77.0)
PLATELETS: 270 10*3/uL (ref 150.0–400.0)
RBC: 4.63 Mil/uL (ref 3.87–5.11)
RDW: 12.9 % (ref 11.5–15.5)
WBC: 5.8 10*3/uL (ref 4.0–10.5)

## 2015-02-28 LAB — COMPREHENSIVE METABOLIC PANEL
ALBUMIN: 4.3 g/dL (ref 3.5–5.2)
ALT: 20 U/L (ref 0–35)
AST: 17 U/L (ref 0–37)
Alkaline Phosphatase: 79 U/L (ref 39–117)
BILIRUBIN TOTAL: 0.7 mg/dL (ref 0.2–1.2)
BUN: 12 mg/dL (ref 6–23)
CALCIUM: 9.1 mg/dL (ref 8.4–10.5)
CO2: 29 mEq/L (ref 19–32)
Chloride: 102 mEq/L (ref 96–112)
Creatinine, Ser: 0.65 mg/dL (ref 0.40–1.20)
GFR: 111.77 mL/min (ref >60.00)
GLUCOSE: 87 mg/dL (ref 70–99)
POTASSIUM: 4.1 meq/L (ref 3.5–5.1)
SODIUM: 137 meq/L (ref 135–145)
Total Protein: 7.2 g/dL (ref 6.0–8.3)

## 2015-02-28 LAB — LIPID PANEL
Cholesterol: 205 mg/dL — ABNORMAL HIGH (ref 0–200)
HDL: 50.5 mg/dL (ref >39.00)
LDL Cholesterol: 132 mg/dL — ABNORMAL HIGH (ref 0–99)
NONHDL: 154.5
Total CHOL/HDL Ratio: 4
Triglycerides: 112 mg/dL (ref 0.0–149.0)
VLDL: 22.4 mg/dL (ref 0.0–40.0)

## 2015-02-28 LAB — TSH: TSH: 4.78 u[IU]/mL — ABNORMAL HIGH (ref 0.35–4.50)

## 2015-03-01 ENCOUNTER — Telehealth: Payer: Self-pay | Admitting: Primary Care

## 2015-03-01 DIAGNOSIS — E039 Hypothyroidism, unspecified: Secondary | ICD-10-CM

## 2015-03-01 MED ORDER — LEVOTHYROXINE SODIUM 175 MCG PO TABS: 175.0000 ug | ORAL_TABLET | Freq: Every day | ORAL | Status: DC

## 2015-03-01 NOTE — Telephone Encounter (Signed)
Will you please notify Ms. Keahey that her TSH (thyroid hormone) is still a little high at 4.78. I would like for her to increase her levothyroxine to daily and have sent a new prescription to her pharmacy. We will also discuss this next week at her physical.  Thank you!

## 2015-03-01 NOTE — Telephone Encounter (Signed)
Called and notified patient of Kate's comments. Patient verbalized understanding.  

## 2015-03-07 ENCOUNTER — Encounter: Payer: Self-pay | Admitting: Primary Care

## 2015-03-07 ENCOUNTER — Ambulatory Visit (INDEPENDENT_AMBULATORY_CARE_PROVIDER_SITE_OTHER): Payer: 59 | Admitting: Primary Care

## 2015-03-07 VITALS — BP 110/70 | HR 81 | Temp 97.9°F | Ht 67.0 in | Wt 175.8 lb

## 2015-03-07 DIAGNOSIS — Z1239 Encounter for other screening for malignant neoplasm of breast: Secondary | ICD-10-CM

## 2015-03-07 DIAGNOSIS — Z0000 Encounter for general adult medical examination without abnormal findings: Secondary | ICD-10-CM | POA: Diagnosis not present

## 2015-03-07 DIAGNOSIS — E039 Hypothyroidism, unspecified: Secondary | ICD-10-CM | POA: Diagnosis not present

## 2015-03-07 NOTE — Progress Notes (Signed)
Pre visit review using our clinic review tool, if applicable. No additional management support is needed unless otherwise documented below in the visit note. 

## 2015-03-07 NOTE — Progress Notes (Signed)
Subjective:    Patient ID: Jaclyn Brandt, female    DOB: 15-Nov-1981, 34 y.o.   MRN: [id number]  HPI  Jaclyn Brandt is a 34 year old female who presents today for complete physical.  Immunizations: -Tetanus: Last tetanus unsure, thinks it's been within 10 years. -Influenza: Was not immunized last season.  Diet: She's been working on a healthy diet and is very active during the day with her kids. She's lost 10 pounds in the past 2 months.  Wt Readings from Last 3 Encounters:  03/07/15 175 lb 12.8 oz (79.742 kg)  02/01/15 176 lb (79.833 kg)  01/24/15 176 lb 12 oz (80.173 kg)    Exercise: Walks daily with her kids and stays active during the day caring for her kids and tending to the house. Pap Smear: Last pap was in 2015 with [facility]. Annual exam was 2 months ago. Mammogram: Never had, but has a family history including her grandmother. Her mother is questionable but has never been screened. Denies masses, changes in skin texture, pain.  1) Hypothyroidism: TSH 4.78 which is decreased from prior TSH of 6.7 from February 2016. She can tell when her levels are off balance as she will feel more tired, have a harder time handling stress, and will get irritable.  Review of Systems  Constitutional: Negative for appetite change and unexpected weight change.  HENT: Negative for rhinorrhea.   Respiratory: Negative for cough and shortness of breath.   Cardiovascular: Negative for chest pain and palpitations.  Gastrointestinal: Negative for diarrhea and constipation.  Genitourinary: Negative for dysuria and frequency.  Musculoskeletal: Negative for myalgias.       Occasional joint pain.  Skin: Negative for rash.  Allergic/Immunologic:       Seasonal allergies  Neurological: Negative for dizziness and headaches.  Psychiatric/Behavioral:       Denies concerns for anxiety or depression.       Past Medical History  Diagnosis Date  . Thyroid disease   . Hemorrhoids   . History  of HPV infection     History   Social History  . Marital Status: Married    Spouse Name: Jaclyn Brandt  . Number of Children: Jaclyn Brandt  . Years of Education: Jaclyn Brandt   Occupational History  . Not on file.   Social History Main Topics  . Smoking status: Former Smoker -- 5 years  . Smokeless tobacco: Never Used  . Alcohol Use: 0.0 oz/week    0 Standard drinks or equivalent per week  . Drug Use: No  . Sexual Activity: Not on file   Other Topics Concern  . Not on file   Social History Narrative   Married   Has 4 children, (3 boys 7 years and 14 months), 66 girl- 33 years old   Live in [place]   Enjoys going out with her husband, hiking.    Past Surgical History  Procedure Laterality Date  . Lipoma excision Left 10/2004    left calf   . Thyroidectomy  03/2008  . Tubal ligation  11/2013  . Hernia repair Left 01/25/2015    Primary repair left femoral hernia.    Family History  Problem Relation Age of Onset  . Heart disease Paternal Grandfather     great aunt/breast  . Cancer Paternal Grandmother     breast  . Bipolar disorder Maternal Grandfather   . Hyperlipidemia Father     No Known Allergies  Current Outpatient Prescriptions on File Prior to Visit  Medication  Sig Dispense Refill  . levothyroxine (SYNTHROID, LEVOTHROID) 175 MCG tablet Take 1 tablet (175 mcg total) by mouth daily before breakfast. 30 tablet 3   No current facility-administered medications on file prior to visit.    BP 110/70 mmHg  Pulse 81  Temp(Src) 97.9 F (36.6 C) (Oral)  Ht 5\' 7"  (1.702 m)  Wt 175 lb 12.8 oz (79.742 kg)  BMI 27.53 kg/m2  SpO2 98%  LMP 03/06/2015     Objective:   Physical Exam  Constitutional: She is oriented to person, place, and time. She appears well-developed.  HENT:  Right Ear: External ear normal.  Left Ear: External ear normal.  Nose: Nose normal.  Mouth/Throat: Oropharynx is clear and moist.  Eyes: EOM are normal. Pupils are equal, round, and reactive to light.  Neck:  Neck supple. No thyromegaly present.  Cardiovascular: Normal rate and regular rhythm.   Pulmonary/Chest: Effort normal and breath sounds normal.  Abdominal: Soft. Bowel sounds are normal. There is no tenderness.  Musculoskeletal: Normal range of motion.  Lymphadenopathy:    She has no cervical adenopathy.  Neurological: She is alert and oriented to person, place, and time. She has normal reflexes.  Skin: Skin is warm and dry.  Psychiatric: She has a normal mood and affect.          Assessment & Plan:

## 2015-03-07 NOTE — Assessment & Plan Note (Signed)
Feeling well overall, but does feel some irritability and notices when her levels are elevated. TSH 4.78.  Increased Levothyroxine to 175 mcg daily. Will re-evaluate in 2 months.

## 2015-03-07 NOTE — Assessment & Plan Note (Signed)
She is following a healthy diet and is staying active with her children daily. Unremarkable examination today. Labs are stable with exception to slightly elevated TSH. Adjustment made to medication. Follows with GYN for paps and annual exams. Will review her [place] records for previous Tetanus. LDL and Total cholesterol slightly elevated, she will continue to work on her diet as she has done for the past 2 months. Will continue to monitor.

## 2015-03-07 NOTE — Patient Instructions (Addendum)
Your blood work looks great except for the TSH. Continue your increased dose at 175 mcg daily.  Schedule a follow up appointment with me in 2 months for recheck of your thyroid. Please call me if you need anything! Take care!

## 2015-03-11 NOTE — Op Note (Signed)
PATIENT NAME:  Jaclyn Brandt, Kenyonna MR#:  [id number] DATE OF BIRTH:  1981-07-12  DATE OF PROCEDURE:  11/26/2013  PREOPERATIVE DIAGNOSIS: Desire for permanent sterility.   POSTOPERATIVE DIAGNOSIS: Desire for permanent sterility.   PROCEDURE PERFORMED: Postpartum bilateral tubal ligation.   SURGEON: Dierdre Searles. Paul Craig Ionescu, M.D.   ANESTHESIA: General.   ESTIMATED BLOOD LOSS: Minimal.   COMPLICATIONS: None.   SPECIMENS: Portion of left and right fallopian tubes.   DISPOSITION: To recovery room in stable condition.    TECHNIQUE: The patient is prepped and draped in the usual sterile fashion after adequate anesthesia is obtained in the supine position on the operating room table. Marcaine 0.5% is used to anesthetize the skin just inferior to the umbilicus followed by a skin incision using a scalpel down to the level of the rectus fascia. The rectus fascia is dissected bilaterally and then the peritoneum is penetrated and retractors are placed. The patient is placed in steep Trendelenburg positioning.   The right fallopian tube is identified and followed out to its fimbria with a Babcock clamp and then the midportion is identified. A loop is tied with 2 Vicryl sutures, excised, and cauterized. The left fallopian tube was identified and followed out to its fimbria and then a midportion loop is tied with 2 Vicryl sutures, excised, and cauterized. Excellent hemostasis is noted. The patient is leveled. The rectus fascia is closed with a 2-0 Vicryl suture and the skin is closed with a 4-0 Vicryl suture in a subcuticular fashion followed by Dermabond. The patient goes to the recovery room in stable condition. All sponge, instrument, and needle counts are correct.    ____________________________ R. Annamarie MajorPaul Geralene Afshar, MD rph:np D: 11/26/2013 12:59:51 ET T: 11/26/2013 15:36:08 ET JOB#: [id number]  cc: Dierdre Searles. Paul Amair Shrout, MD, <Dictator> Nadara MustardOBERT P Itza Maniaci MD ELECTRONICALLY SIGNED 11/26/2013 17:26

## 2015-03-19 NOTE — Op Note (Signed)
PATIENT NAME:  [name], [name] MR#:  [id number] DATE OF BIRTH:  26-Aug-1981  DATE OF PROCEDURE:  01/25/2015  PREOPERATIVE DIAGNOSIS:  Left groin mass.   POSTOPERATIVE DIAGNOSIS:  Left femoral hernia.   OPERATIVE PROCEDURE:  Repair of left femoral hernia.   SURGEON:  Earline MayotteJeffrey W. Tabetha Haraway, MD   ANESTHESIA:  General by LMA, Marcaine 0.5% with 1:200,000 units of epinephrine 10 mL local infiltration.   ESTIMATED BLOOD LOSS:  Minimal.   CLINICAL NOTE:  This 34 year old woman presented with a 2-year history of a mass in the left groin. Ultrasound did not show an echo pattern suggestive of a lymph node, but more of adipose tissue. She is brought to the operating room for planned excision.   OPERATIVE NOTE:  With the patient under adequate general anesthesia, the area was prepped with ChloraPrep and draped. The patient had shaved the area herself prior to presenting to the hospital.   Marcaine was infiltrated for postoperative analgesia. The mass was confirmed by palpation. A skin line incision was made in the left groin and carried down through the skin and subcutaneous tissue with hemostasis achieved by electrocautery. A pad of adipose tissue was found to be coming through the femoral canal. No associated sac. It was not possible to reduce this due to the less than 1 cm opening, and the adipose tissue was excised and ligated with 3-0 Vicryl ties. The defect was then approximated with interrupted 0 Surgilon simple sutures between the inguinal ligament and the inferior ramus of the pubis. The adipose layer was closed with 3-0 Vicryl and the skin closed with a running 4-0 Vicryl subcuticular suture. Benzoin, Steri-Strips, Telfa, and Tegaderm dressings were applied.   The patient tolerated the procedure well and was taken to the recovery room in stable condition.   ____________________________ Earline MayotteJeffrey W. Orvill Coulthard, MD jwb:nb D: 01/25/2015 18:06:24 ET T: 01/26/2015 01:24:30  ET JOB#: [id number]  cc: Earline MayotteJeffrey W. Reyan Helle, MD, <Dictator> R. Annamarie MajorPaul Harris, MD Doyel Mulkern Brion AlimentW Arijana Narayan MD ELECTRONICALLY SIGNED 01/26/2015 12:50

## 2015-03-28 NOTE — H&P (Signed)
L&D Evaluation:  History Expanded:  HPI 34 year old G7 P2042 with mono/di twins presents today at 356w2d  based on an EDC=12/22/2013 by a 6wk1d scan with c/o contractions. She reports that she has been having contractions slightly more than usual, coming every 7-10 minutes. [place] and [facility] when she moved to this area. Her care has been comanaged by [facility] who have been doing US for growth q2 weeks. Last growth scan reveal good fetal growth of both twins with the EFW concordant with normal fluid. There is mild pelviectasis in both twin's kidneys. PNC also c/b hypothyroidism after a thyroidectomy (on levothyroxine). She is RH negative and did receive Rhogam at 28 weeks. LABS :O neg, VI, RI   Blood Type (Maternal) O negative   Maternal HIV Negative   Maternal Syphilis Ab Nonreactive   Maternal Varicella Immune   Rubella Results (Maternal) immune   Presents with contractions   Patient's Medical History Hypothyroid.   Medications Pre [place] Vitamins  Levothyroxine   Allergies NKDA   Social History none   Family History Pt and husband are both CF carriers   ROS:  ROS see HPI   Exam:  Vital Signs stable   General no apparent distress   Mental Status clear   Abdomen gravid, non-tender   Fetal Position cephalic/breech by bedside ultrasound.   Back no CVAT   Edema no edema   Pelvic no external lesions, 3/50/-3   Mebranes Intact   FHT normal rate with no decels, TWIN A-120/mod var/+accels/no decels. Twin B-120/mod var/+accels/no decels   Ucx irregular/infrequent   Skin dry, no lesions, no rashes   Lymph no lymphadenopathy   Other Bedside ultrasound shows twin A in cephalic presentation, twin B is tvs/breech.  grossly normal amniotic fluid.   Impression:  Impression Mono/Di TWIN IUP at 1856w2d no evidence of active labor   Plan:  Plan EFM/NST, monitor contractions and for cervical change, other   Comments -discussed plan with patient.  She  is not in active labor and is essentially unchanged in dilation since Monday (by my exam both times).  Discussed giving her another interval of time to see if her contractions pick up and to ensure she is not transitioning to active labor.  However, she is comfortable going home and coming back, if necessary.  I reiterated that I have no experience with breech delivery of twins.  if available, I would call in Dr. Bonney AidStaebler, who has agreed to come in for assistance with delivery.  Failing that, I would have to perform a cesarean section as that is the safest route by me based on my experience.  She acknowledges this, though should would prefer to have a vaginal delivery.   2) discussed firm plans for Induction of labor today. Will arrange for 1/7 with Dr. Evelena AsaStaelber, unless she has any symptoms of labor, bleeding rupture of membranes, or decreased fetal movement, or a clinical indications (oligohydramnios, twin growth discordance, etc).   Follow Up Appointment already scheduled. [facility] and [facility]   Electronic Signatures: Conard NovakJackson, Marijke Guadiana D (MD)  (Signed 02-Jan-15 12:02)  Authored: L&D Evaluation   Last Updated: 02-Jan-15 12:02 by Conard NovakJackson, Mazi Brailsford D (MD)

## 2015-03-28 NOTE — H&P (Signed)
L&D Evaluation:  History Expanded:  HPI 34 yo w twins at 21 weeks w mid back pain, worsening today.  Mild nausea.  No contractions, vb, rom, abd pain, RUQ pain, CP, SOB, dysuria, fever, chills, hematuria, BM changes. [facility], also sees [facility].   Presents with back pain   Patient's Medical History No Chronic Illness   Patient's Surgical History none   Medications Pre Natal Vitamins   Allergies NKDA   Social History none   Family History Non-Contributory   Current Prenatal Course Notable For Multiple Gestation   ROS:  ROS All systems were reviewed.  HEENT, CNS, GI, GU, Respiratory, CV, Renal and Musculoskeletal systems were found to be normal.   Exam:  Vital Signs stable   General no apparent distress   Mental Status clear   Abdomen gravid, non-tender   Estimated Fetal Weight Average for gestational age   Back No flank T, No CVAT, Midline Thoracic MS pain of spine.   Edema no edema   Pelvic no external lesions, cervix closed and thick   FHT 140s, x2   Ucx absent   Impression:  Impression BACK PAIN.   Plan:  Plan UA   Comments Fetal Well-being Reassuring  If not kidney related then MS.  Tx options discussed.   Follow Up Appointment already scheduled   Electronic Signatures: Letitia LibraHarris, Connelly Spruell Paul (MD)  (Signed 19-Sep-14 23:11)  Authored: L&D Evaluation   Last Updated: 19-Sep-14 23:11 by Letitia LibraHarris, Ellena Kamen Paul (MD)

## 2015-03-28 NOTE — H&P (Signed)
L&D Evaluation:  History:  HPI 34 year old G7 P2042 with mono/di twins presents today from the office at 31 weeks based on an EDC=12/22/2013 by a 6wk1d scan with c/o decreased FM of Twin A x 24 hours. She also has been having some sharp pains in suprapubic/vaginal area and c/o a mucoid discharge as well as some vulvar irritation/itching. [place] and [facility] she moved to this area. Her care has been comanaged by [facility] who have been doing US for growth q2 weeks. Last growth scan 11/24 reveal good fetal growth of both twins with the EFWin the in the 90th and 84th %. There is mild pelviectasis in both twin's kidneys. PNC also c/b hypothyroidism after a thyroidectomy (on levothyroxine). She is RH negative and did receive Rhogam at 28 weeks. LABS :O neg, VI, RI   Presents with decreased fetal movement, of Twin A (maternal left)   Patient's Medical History Hypothyroid.    Medications Pre [place]atal Vitamins  Levothyroxine    Allergies NKDA   Social History none    Family History Pt and husband are both CF carriers    ROS:  ROS see HPI   Exam:  Vital Signs stable  111/69    General no apparent distress   Mental Status clear    Abdomen gravid, non-tender   Fetal Position cephalic/cephalic   Pelvic Vulva inflammed/ white cottage cheese discharge. wet prep positive for hyphae. CX: ext os 1, but int os ?FT/thick/-2/ posterior   Mebranes Intact   FHT TWIN A-120 with accels to 150s-170. Twin B-115 with accels to 140s and one variable decel to 90 x 50 sec   Ucx one ctx in 30 min   Impression:  Impression TWIN IUP at 31 weeks with reactive NST x2. Monilial vulvovaginitis. Discomforts of pregnancy   Plan:  Plan Diflucan 150 mgm x2-take one tab today and repeat one in 3 days.. Mycolog II ext tid. [facility]/US or sooner in L&D for problems.   Electronic Signatures: Trinna BalloonGutierrez, Mylo Driskill L (CNM)  (Signed 04-Dec-14 01:42)  Authored: L&D  Evaluation   Last Updated: 04-Dec-14 01:42 by Trinna BalloonGutierrez, Camylle Whicker L (CNM)

## 2015-03-28 NOTE — H&P (Signed)
L&D Evaluation:  History:  HPI 34 year old G7 P2042 with mono/di twins presents today at 32 weeks based on an EDC=12/22/2013 by a 6wk1d scan with c/o contractions and possibly leaking fluid. She also has been having some sharp pains in the right suprapubic area that has been going on for several weeks during this pregnancy, but was increased this am. She reports that she has been having contractions on and off over the last several weeks but they have increased in severity this morning around 6am. She was seen 1 week ago in L&D for c/o decreased fetal movement and a yeast infection. [place]NC begun in [place] and [facility] she moved to this area. Her care has been comanaged by [facility] who have been doing US for growth q2 weeks. Last growth scan 11/24 reveal good fetal growth of both twins with the EFW in the 90th and 84th %. There is mild pelviectasis in both twin's kidneys. PNC also c/b hypothyroidism after a thyroidectomy (on levothyroxine). She is RH negative and did receive Rhogam at 28 weeks. LABS :O neg, VI, RI   Presents with contractions   Patient's Medical History Hypothyroid.   Medications Pre [place]atal Vitamins  Levothyroxine   Allergies NKDA   Social History none   Family History Pt and husband are both CF carriers   ROS:  ROS see HPI   Exam:  Vital Signs stable   General no apparent distress   Mental Status clear   Abdomen gravid, non-tender   Fetal Position cephalic/cephalic at last visit to L&D   Pelvic no external lesions, FT internally/30/-3   Mebranes Intact   FHT normal rate with no decels, TWIN A-120 with accels to 150s. Twin B-115 with accels to 130s accels to 160's.   Ucx every 3-4 minutes, mild   Skin dry, no lesions, no rashes   Lymph no lymphadenopathy   Other FFN negative, urine pending   Impression:  Impression TWIN IUP at 32 weeks with reactive NST x2.  r/o PTL   Plan:  Plan EFM/NST, monitor contractions and for cervical change, other,  pending labs   Follow Up Appointment already scheduled. tomorrow with [facility]   Electronic Signatures: Jannet MantisSubudhi, Mylissa Lambe (CNM)  (Signed 10-Dec-14 12:58)  Authored: L&D Evaluation   Last Updated: 10-Dec-14 12:58 by Jannet MantisSubudhi, Beckam Abdulaziz (CNM)

## 2015-05-09 ENCOUNTER — Ambulatory Visit: Payer: 59 | Admitting: Primary Care

## 2015-07-09 ENCOUNTER — Other Ambulatory Visit: Payer: Self-pay | Admitting: Primary Care

## 2015-07-10 NOTE — Telephone Encounter (Signed)
Due for repeat TSH

## 2015-07-10 NOTE — Telephone Encounter (Signed)
Left message with mother in law so patient will call back.

## 2015-07-10 NOTE — Telephone Encounter (Signed)
Electronically refill request  Las prescribed on 03/01/2015   levothyroxine (SYNTHROID, LEVOTHROID) 175 MCG tablet  Dispense: 30 tablet   Refills: 3  Last seen on 03/07/15. No future apt.

## 2015-07-13 NOTE — Telephone Encounter (Addendum)
Left message for patient to call on 07/12/15. Sending letter asking patient to call and schedule lab apt.

## 2015-08-14 ENCOUNTER — Other Ambulatory Visit: Payer: Self-pay | Admitting: *Deleted

## 2015-08-14 MED ORDER — LEVOTHYROXINE SODIUM 175 MCG PO TABS: ORAL_TABLET | ORAL | Status: DC

## 2015-08-14 NOTE — Telephone Encounter (Signed)
Patient left a voicemail stating that she has not had her thyroid level checked in a while and she knows that it has been all over the place. Patient stated that she does not have any insurance and wants to know if you will refill the last dose of her thyroid that she had filled? Patient stated that she is in the process of trying to get some insurance. Please let her know if this can be sent in for her?

## 2015-08-15 NOTE — Telephone Encounter (Signed)
Message left for patient to return my call.  

## 2015-08-16 NOTE — Telephone Encounter (Signed)
Message left for patient to return my call.  

## 2015-08-21 NOTE — Telephone Encounter (Signed)
Sending letter Jaclyn Brandt comments for patient since patient did not responded back.

## 2015-09-21 ENCOUNTER — Other Ambulatory Visit: Payer: Self-pay

## 2015-09-21 DIAGNOSIS — E039 Hypothyroidism, unspecified: Secondary | ICD-10-CM

## 2015-09-21 MED ORDER — LEVOTHYROXINE SODIUM 175 MCG PO TABS: ORAL_TABLET | ORAL | Status: DC

## 2015-09-21 NOTE — Telephone Encounter (Signed)
Pt request refills levothyroxine 175 mcg to [facility] [address]. Pt will be out of med on 09/22/15. Pt had CPX on 03/07/15 and had f/u visit 05/09/15 which pt had to cancel due to no ins. Pt does not have ins now and is not sure when will get ins. Pt request refill to [facility]; if pt needs to be seen pt does not have any money at this time for office visit. Last refilled # 30 on 08/14/15. Please advise.

## 2015-09-26 NOTE — Telephone Encounter (Signed)
Message left for patient to return my call.  

## 2015-09-28 NOTE — Telephone Encounter (Signed)
Since patient did return phone calls on 09/22/2015, 09/26/2015, and 09/28/2015. Sent a letter on 09/28/2015 regarding if patient can come for a lab appt for TSH which would be around $30.00.

## 2015-10-11 ENCOUNTER — Encounter: Payer: Self-pay | Admitting: Family Medicine

## 2015-10-11 ENCOUNTER — Ambulatory Visit (INDEPENDENT_AMBULATORY_CARE_PROVIDER_SITE_OTHER): Payer: Self-pay | Admitting: Family Medicine

## 2015-10-11 ENCOUNTER — Other Ambulatory Visit: Payer: Self-pay | Admitting: Primary Care

## 2015-10-11 VITALS — BP 106/70 | HR 82 | Temp 98.3°F | Ht 67.0 in | Wt 173.0 lb

## 2015-10-11 DIAGNOSIS — J069 Acute upper respiratory infection, unspecified: Secondary | ICD-10-CM

## 2015-10-11 DIAGNOSIS — E039 Hypothyroidism, unspecified: Secondary | ICD-10-CM

## 2015-10-11 MED ORDER — BENZONATATE 200 MG PO CAPS: 200.0000 mg | ORAL_CAPSULE | Freq: Three times a day (TID) | ORAL | Status: DC | PRN

## 2015-10-11 MED ORDER — AMOXICILLIN-POT CLAVULANATE 875-125 MG PO TABS: 1.0000 | ORAL_TABLET | Freq: Two times a day (BID) | ORAL | Status: DC

## 2015-10-11 NOTE — Progress Notes (Signed)
Pre visit review using our clinic review tool, if applicable. No additional management support is needed unless otherwise documented below in the visit note. 

## 2015-10-11 NOTE — Progress Notes (Signed)
Patient ID: Jaclyn Brandt, female   DOB: June 08, 1981, 34 y.o.   MRN: [id number]  Jaclyn AlarEric Kenadee Gates, MD Phone: [phone]  Jaclyn DaysCassandra Brandt is a 34 y.o. female who presents today for same-day visit.  URI: Patient presents with onset of symptoms on Saturday notes she felt warm that day had some chills as well. She had had some postnasal drip and yellow mucus out of her nose. She denies appreciable sinus pressure. She does note some minimal frontal headache that is consistent with her prior viral illnesses. She's not been short of breath or had chest pain. She denies any numbness weakness or vision changes. She notes minimally productive cough. She notes her ears feel full as well. She notes her kids have been sick with similar illnesses. They all received antibiotics. Overall she feels as though she has improved some.  PMH: Former smoker.   ROS see history of present illness  Objective  Physical Exam Filed Vitals:   10/11/15 1450  BP: 106/70  Pulse: 82  Temp: 98.3 F (36.8 C)   Physical Exam  Constitutional: She is well-developed, well-nourished, and in no distress.  Nontoxic, no acute distress  HENT:  Head: Normocephalic and atraumatic.  Right Ear: External ear normal.  Left Ear: External ear normal.  Mouth/Throat: Oropharynx is clear and moist.  No tonsillar swelling or exudate, left TM appears normal, right TM with mild erythema, there is good light reflex, there is no bulging, there is no purulent material behind the TM  Eyes: Conjunctivae are normal. Pupils are equal, round, and reactive to light.  Neck: Neck supple.  Cardiovascular: Normal rate, regular rhythm and normal heart sounds.  Exam reveals no gallop and no friction rub.   No murmur heard. Pulmonary/Chest: Effort normal and breath sounds normal. No respiratory distress. She has no wheezes. She has no rales.  Lymphadenopathy:    She has no cervical adenopathy.  Neurological: She is alert.  CN 2-12 intact, 5/5  strength in bilateral biceps, triceps, grip, quads, hamstrings, plantar and dorsiflexion, sensation to light touch intact in bilateral UE and LE, normal gait, 2+ patellar reflexes  Skin: Skin is warm and dry. She is not diaphoretic.     Assessment/Plan: Please see individual problem list.  Acute upper respiratory infection Patient's symptoms are consistent with viral upper respiratory infection. She has no sinus pressure to indicate a bacterial sinusitis. Right TM is mildly erythematous, there is no evidence of purulent infection behind the TM on visualization. She is afebrile at this time. Discussed that this is likely a viral illness and will get better with time. We will treat her cough with Tessalon. I will provide her with a prescription for Augmentin to take if she is not improved in the next 2-3 days. She was advised to only take this if she is stable and not improved. She is advised that if she has worsening symptoms or any of the return precautions she should be seen prior to starting the antibiotic. She's given return precautions.    Meds ordered this encounter  Medications  . benzonatate (TESSALON) 200 MG capsule    Sig: Take 1 capsule (200 mg total) by mouth 3 (three) times daily as needed for cough.    Dispense:  20 capsule    Refill:  0  . amoxicillin-clavulanate (AUGMENTIN) 875-125 MG tablet    Sig: Take 1 tablet by mouth 2 (two) times daily.    Dispense:  14 tablet    Refill:  0    Jaclyn AlarEric Patches Brandt

## 2015-10-11 NOTE — Assessment & Plan Note (Signed)
Patient's symptoms are consistent with viral upper respiratory infection. She has no sinus pressure to indicate a bacterial sinusitis. Right TM is mildly erythematous, there is no evidence of purulent infection behind the TM on visualization. She is afebrile at this time. Discussed that this is likely a viral illness and will get better with time. We will treat her cough with Tessalon. I will provide her with a prescription for Augmentin to take if she is not improved in the next 2-3 days. She was advised to only take this if she is stable and not improved. She is advised that if she has worsening symptoms or any of the return precautions she should be seen prior to starting the antibiotic. She's given return precautions.

## 2015-10-11 NOTE — Patient Instructions (Signed)
Nice to meet you. You likely have a viral illness. We will put you on Tessalon for cough. I have given you a prescription for Augmentin to take only if your symptoms have not improved in 2-3 days. If you develop new symptoms or worsen do need to be evaluated. If you develop chest pain, shortness of breath, headache, numbness, weakness, vision changes, fever, ear pain, or any new or change in symptoms please seek medical attention.

## 2015-10-16 ENCOUNTER — Other Ambulatory Visit: Payer: 59

## 2015-12-01 ENCOUNTER — Other Ambulatory Visit: Payer: Self-pay | Admitting: Primary Care

## 2015-12-05 NOTE — Telephone Encounter (Signed)
Spoke with patient, she should be able to come in for repeat TSH within the next several weeks. Refill sent.

## 2015-12-05 NOTE — Telephone Encounter (Signed)
Electronically refill request for   levothyroxine (SYNTHROID, LEVOTHROID) 175 MCG tablet   TAKE ONE TABLET BY MOUTH ONCE DAILY BEFORE BREAKFAST  Dispense: 30 tablet   Refills: 0     Last prescribed on 07/10/2015. Last seen for acute on 10/11/2015. No show lab appt on 10/16/2015. No future appt.

## 2015-12-25 ENCOUNTER — Other Ambulatory Visit: Payer: Self-pay | Admitting: Primary Care

## 2016-01-02 ENCOUNTER — Other Ambulatory Visit: Payer: Self-pay | Admitting: Primary Care

## 2016-01-02 NOTE — Telephone Encounter (Signed)
Electronically refill request for   levothyroxine (SYNTHROID, LEVOTHROID) 175 MCG tablet   TAKE ONE TABLET BY MOUTH ONCE DAILY BEFORE BREAKFAST  Dispense: 30 tablet   Refills: 0     Last prescribed on 12/05/2015.

## 2016-01-02 NOTE — Telephone Encounter (Signed)
Message left for patient to return my call.  

## 2016-01-05 NOTE — Telephone Encounter (Signed)
Message left for patient to return my call.  

## 2016-01-31 ENCOUNTER — Other Ambulatory Visit: Payer: Self-pay | Admitting: Primary Care

## 2016-02-01 ENCOUNTER — Telehealth: Payer: Self-pay | Admitting: *Deleted

## 2016-02-01 NOTE — Telephone Encounter (Signed)
She will most definitely need an office visit for this as she may require labs and an ultrasound. I highly encourage her to come in for evaluation, especially if she's experiencing moderate to severe pain and fevers. She should reduce intake of fried foods, fatty foods, spicy foods.

## 2016-02-01 NOTE — Telephone Encounter (Signed)
Lm on pts vm and informed her lab visit required. Advised pt to contact office to schedule. Ok for lab appt only per [place], as pt no longer has insurance coverage

## 2016-02-01 NOTE — Telephone Encounter (Signed)
Pt left voicemail at Triage, pt has no insurance so she can't come in for an appt., pt said she thinks she has been having "galbladder attacks" pt wants to know what should she do or who should she see. pt said she would like to get a "diagnosis with as few appt. as possible", pt request call back

## 2016-02-02 NOTE — Telephone Encounter (Signed)
Called and notified patient of Kate's comments. Patient verbalized understanding. Office visit is schedule for 02/07/2016.

## 2016-02-05 ENCOUNTER — Telehealth: Payer: Self-pay | Admitting: Primary Care

## 2016-02-05 NOTE — Telephone Encounter (Signed)
Called and spoken to patient. Patient was not sure if she should come to office due to not having gallbladder attack at the moment. Notified her that it is up to her if she would like to come or not. The only thing is that Jae DireKate would like her TSH checked to make sure, patient is on the correct dosage. Patient stated that she is going to keep the appointment and see what Jae DireKate thinks.

## 2016-02-05 NOTE — Telephone Encounter (Signed)
Pt has questions about her upcoming appt - please call back [phone]

## 2016-02-07 ENCOUNTER — Other Ambulatory Visit: Payer: Self-pay | Admitting: Primary Care

## 2016-02-07 ENCOUNTER — Other Ambulatory Visit: Payer: Self-pay

## 2016-02-07 ENCOUNTER — Telehealth: Payer: Self-pay

## 2016-02-07 ENCOUNTER — Ambulatory Visit (INDEPENDENT_AMBULATORY_CARE_PROVIDER_SITE_OTHER): Payer: Self-pay | Admitting: Primary Care

## 2016-02-07 ENCOUNTER — Encounter: Payer: Self-pay | Admitting: Primary Care

## 2016-02-07 ENCOUNTER — Ambulatory Visit
Admission: RE | Admit: 2016-02-07 | Discharge: 2016-02-07 | Disposition: A | Discharge status: Home / Self Care | Payer: Self-pay | Source: Ambulatory Visit | Attending: Primary Care | Admitting: Primary Care

## 2016-02-07 VITALS — BP 106/68 | HR 75 | Temp 97.9°F | Ht 67.0 in | Wt 179.8 lb

## 2016-02-07 DIAGNOSIS — E039 Hypothyroidism, unspecified: Secondary | ICD-10-CM

## 2016-02-07 DIAGNOSIS — K769 Liver disease, unspecified: Secondary | ICD-10-CM | POA: Insufficient documentation

## 2016-02-07 DIAGNOSIS — R1010 Upper abdominal pain, unspecified: Secondary | ICD-10-CM

## 2016-02-07 DIAGNOSIS — K8020 Calculus of gallbladder without cholecystitis without obstruction: Secondary | ICD-10-CM | POA: Insufficient documentation

## 2016-02-07 LAB — CBC WITH DIFFERENTIAL/PLATELET
BASOS PCT: 0.3 % (ref 0.0–3.0)
Basophils Absolute: 0 10*3/uL (ref 0.0–0.1)
Eosinophils Absolute: 0.1 10*3/uL (ref 0.0–0.7)
Eosinophils Relative: 1.6 % (ref 0.0–5.0)
HEMATOCRIT: 38.6 % (ref 36.0–46.0)
Hemoglobin: 13.1 g/dL (ref 12.0–15.0)
LYMPHS PCT: 24.8 % (ref 12.0–46.0)
Lymphs Abs: 1.4 10*3/uL (ref 0.7–4.0)
MCHC: 34 g/dL (ref 30.0–36.0)
MCV: 85.8 fl (ref 78.0–100.0)
MONOS PCT: 4.9 % (ref 3.0–12.0)
Monocytes Absolute: 0.3 10*3/uL (ref 0.1–1.0)
NEUTROS ABS: 4 10*3/uL (ref 1.4–7.7)
Neutrophils Relative %: 68.4 % (ref 43.0–77.0)
PLATELETS: 300 10*3/uL (ref 150.0–400.0)
RBC: 4.5 Mil/uL (ref 3.87–5.11)
RDW: 12.5 % (ref 11.5–15.5)
WBC: 5.8 10*3/uL (ref 4.0–10.5)

## 2016-02-07 LAB — COMPREHENSIVE METABOLIC PANEL
ALT: 23 U/L (ref 0–35)
AST: 20 U/L (ref 0–37)
Albumin: 4.3 g/dL (ref 3.5–5.2)
Alkaline Phosphatase: 71 U/L (ref 39–117)
BUN: 10 mg/dL (ref 6–23)
CALCIUM: 9.1 mg/dL (ref 8.4–10.5)
CHLORIDE: 101 meq/L (ref 96–112)
CO2: 32 meq/L (ref 19–32)
Creatinine, Ser: 0.59 mg/dL (ref 0.40–1.20)
GFR: 124.27 mL/min (ref >60.00)
Glucose, Bld: 85 mg/dL (ref 70–99)
POTASSIUM: 3.6 meq/L (ref 3.5–5.1)
Sodium: 139 mEq/L (ref 135–145)
Total Bilirubin: 0.7 mg/dL (ref 0.2–1.2)
Total Protein: 7.1 g/dL (ref 6.0–8.3)

## 2016-02-07 LAB — LIPASE: LIPASE: 10 U/L — AB (ref 11.0–59.0)

## 2016-02-07 LAB — TSH: TSH: 0.37 u[IU]/mL (ref 0.35–4.50)

## 2016-02-07 NOTE — Telephone Encounter (Signed)
Called and spoke with patient in greater detail. She is to call us back tomorrow with her answer regarding CT vs MRI for lesion to liver. She would like to hold off on surgical consult at this time.

## 2016-02-07 NOTE — Progress Notes (Signed)
Pre visit review using our clinic review tool, if applicable. No additional management support is needed unless otherwise documented below in the visit note. 

## 2016-02-07 NOTE — Patient Instructions (Signed)
Complete lab work prior to leaving today. I will notify you of your results once received.   Stop by the front desk and speak with either Shirlee LimerickMarion or Revonda StandardAllison regarding your Ultrasound.  I will be in touch soon.  It was a pleasure to see you today!

## 2016-02-07 NOTE — Telephone Encounter (Signed)
Thanks.  Routed to [facility] for input.

## 2016-02-07 NOTE — Assessment & Plan Note (Signed)
No recent repeat TSH since April 2016. Could not come in due to financial issues, but meds were refilled. Due for TSH today which is pending. She is symptomatic which will likely call for adjustment in meds. Labs pending.

## 2016-02-07 NOTE — Telephone Encounter (Signed)
Jud[facility]phanie [facility] report on US Abd.; pt was waiting; Mayra ReelKate Clark NP out of office and showed report to Dr Para Marchuncan. Dr Para Marchuncan said + for gall stones; GB wall itself is OK and no acute cholecystitis. On US also saw 2 cm liver spot of unknown significance and pt needs MRI VS CT. Dr Para Marchuncan said needs to review lab results when available to help determine how to go forward and with what testing. Pt voiced understanding and will await a cb probably on 02/08/16. FYI to Dr Para Marchuncan and Mayra ReelKate Clark NP.

## 2016-02-07 NOTE — Progress Notes (Signed)
Subjective:    Patient ID: Jaclyn Brandt, female    DOB: [birth year], 35 y.o.   MRN: [id number]  HPI  Jaclyn Brandt is a 35 year old female who presents today for follow up and for a chief complaint of abdominal pain.  1) Hypothyroidism: Currently managed on levothyroxine 175 mcg tablets that was increased nearly 1 year ago. She was due for follow up in November 2016 but did not come in for re-testing as she had financial struggles. She is experiencing fatigue, weight gain, difficulty sleeping. She believes her levels are off as this is how she felt in the past. She has been taking her medication daily as prescribed. Her medication continued to be refilled despite follow up.  2) Abdominal Pain: Located to the RUQ, periumbilical/epigastric region that radiates through to her back. She will get attacks once monthly that will last 1 week at a time. This has been going on since September 2016. She will experience diarrhea and nausea each time she eats during the attacks. Denies bloody stools, urinary and vaginal symptoms. She is having daily bowel movements which is abnormal for her as she was constipated prior to these attacks.  She did run a fever with this last attack. FH of cholecystomy from maternal grandmother.   She endorses a low fat diet that currently consists of: Breakfast: Cereal or waffles Lunch: Left overs, veggies Dinner: Meat, vegetable, starch. Limited fried/greasy foods. Snacks: Occasionally. Crackers. Desserts: None Beverages: Water, coffee, sweet tea with Stevia  Review of Systems  Constitutional: Positive for fatigue. Negative for fever and chills.  Gastrointestinal: Positive for nausea and abdominal pain. Negative for vomiting, diarrhea and constipation.  Genitourinary: Negative for dysuria.  Musculoskeletal: Positive for back pain.  Psychiatric/Behavioral: Positive for sleep disturbance.       Past Medical History  Diagnosis Date  . Thyroid disease   . Hemorrhoids    . History of HPV infection     Social History   Social History  . Marital Status: Married    Spouse Name: N/A  . Number of Children: N/A  . Years of Education: N/A   Occupational History  . Not on file.   Social History Main Topics  . Smoking status: Former Smoker -- 5 years  . Smokeless tobacco: Never Used  . Alcohol Use: 0.0 oz/week    0 Standard drinks or equivalent per week  . Drug Use: No  . Sexual Activity: Not on file   Other Topics Concern  . Not on file   Social History Narrative   Married   Has 4 children, (3 boys 7 years and 14 months), 83 girl- 34 years old   Live in [place]   Enjoys going out with her husband, hiking.    Past Surgical History  Procedure Laterality Date  . Lipoma excision Left 10/2004    left calf   . Thyroidectomy  03/2008  . Tubal ligation  11/2013  . Hernia repair Left 01/25/2015    Primary repair left femoral hernia.    Family History  Problem Relation Age of Onset  . Heart disease Paternal Grandfather     great aunt/breast  . Cancer Paternal Grandmother     breast  . Bipolar disorder Maternal Grandfather   . Hyperlipidemia Father     No Known Allergies  Current Outpatient Prescriptions on File Prior to Visit  Medication Sig Dispense Refill  . levothyroxine (SYNTHROID, LEVOTHROID) 175 MCG tablet TAKE ONE TABLET BY MOUTH ONCE DAILY BEFORE BREAKFAST 30  tablet 0   No current facility-administered medications on file prior to visit.    BP 106/68 mmHg  Pulse 75  Temp(Src) 97.9 F (36.6 C) (Oral)  Ht 5\' 7"  (1.702 m)  Wt 179 lb 12.8 oz (81.557 kg)  BMI 28.15 kg/m2  SpO2 98%  LMP 12/24/2015    Objective:   Physical Exam  Constitutional: She appears well-nourished. No distress.  Neck:  Thyroid removed  Cardiovascular: Normal rate and regular rhythm.   Pulmonary/Chest: Effort normal and breath sounds normal.  Abdominal: Soft. Normal appearance and bowel sounds are normal. There is tenderness in the epigastric area.  There is positive Murphy's sign. There is no rigidity, no guarding, no CVA tenderness and no tenderness at McBurney's point.  Skin: Skin is warm and dry.          Assessment & Plan:  Abdominal Pain:  Located to RUQ and epigastric region with radiation through to back. Intermittently present since September 2016, worse over last several months.  Exam with positive murphy's sign and tenderness to epigastric region. Stat US ordered today. Stat CBC, CMP, Lipase also pending. Suspect gall stones but will rule out pancreatitis or any other abnormalities. She is in no distress and does not appear ill. Will be in touch with patient regarding results.

## 2016-03-01 ENCOUNTER — Other Ambulatory Visit: Payer: Self-pay | Admitting: Primary Care

## 2016-03-01 DIAGNOSIS — E039 Hypothyroidism, unspecified: Secondary | ICD-10-CM

## 2016-03-04 NOTE — Telephone Encounter (Signed)
Electronically refill request for   levothyroxine (SYNTHROID, LEVOTHROID) 175 MCG tablet   TAKE ONE TABLET BY MOUTH ONCE DAILY BEFORE BREAKFAST  Dispense: 30 tablet   Refills: 0     Last prescribed on 02/01/2016. Last seen on 02/07/2016. No future appt.

## 2016-05-28 ENCOUNTER — Other Ambulatory Visit: Payer: Self-pay | Admitting: Primary Care

## 2016-05-28 DIAGNOSIS — E039 Hypothyroidism, unspecified: Secondary | ICD-10-CM

## 2016-05-28 NOTE — Telephone Encounter (Signed)
Electronically refill request for   levothyroxine (SYNTHROID, LEVOTHROID) 175 MCG tablet   TAKE ONE TABLET BY MOUTH ONCE DAILY BEFORE BREAKFAST  Dispense: 90 tablet   Refills: 0     Last prescribed on 03/04/2016. Last seen on 02/07/2016. No future appointment.

## 2016-08-31 ENCOUNTER — Other Ambulatory Visit: Payer: Self-pay | Admitting: Primary Care

## 2016-08-31 DIAGNOSIS — E039 Hypothyroidism, unspecified: Secondary | ICD-10-CM

## 2016-10-16 ENCOUNTER — Emergency Department: Payer: Self-pay

## 2016-10-16 ENCOUNTER — Telehealth: Payer: Self-pay

## 2016-10-16 ENCOUNTER — Encounter: Payer: Self-pay | Admitting: Emergency Medicine

## 2016-10-16 ENCOUNTER — Emergency Department
Admission: EM | Admit: 2016-10-16 | Discharge: 2016-10-16 | Disposition: A | Discharge status: Home / Self Care | Payer: Self-pay | Attending: Emergency Medicine | Admitting: Emergency Medicine

## 2016-10-16 DIAGNOSIS — E039 Hypothyroidism, unspecified: Secondary | ICD-10-CM | POA: Insufficient documentation

## 2016-10-16 DIAGNOSIS — N200 Calculus of kidney: Secondary | ICD-10-CM

## 2016-10-16 DIAGNOSIS — Z87891 Personal history of nicotine dependence: Secondary | ICD-10-CM | POA: Insufficient documentation

## 2016-10-16 DIAGNOSIS — K8020 Calculus of gallbladder without cholecystitis without obstruction: Secondary | ICD-10-CM

## 2016-10-16 LAB — BASIC METABOLIC PANEL
Anion gap: 9 (ref 5–15)
BUN: 10 mg/dL (ref 6–20)
CHLORIDE: 101 mmol/L (ref 101–111)
CO2: 27 mmol/L (ref 22–32)
Calcium: 9.1 mg/dL (ref 8.9–10.3)
Creatinine, Ser: 0.64 mg/dL (ref 0.44–1.00)
GFR calc non Af Amer: >60 mL/min (ref >60)
Glucose, Bld: 92 mg/dL (ref 65–99)
POTASSIUM: 3.8 mmol/L (ref 3.5–5.1)
SODIUM: 137 mmol/L (ref 135–145)

## 2016-10-16 LAB — HEPATIC FUNCTION PANEL
ALT: 67 U/L — AB (ref 14–54)
AST: 46 U/L — AB (ref 15–41)
Albumin: 4.4 g/dL (ref 3.5–5.0)
Alkaline Phosphatase: 84 U/L (ref 38–126)
Bilirubin, Direct: 0.2 mg/dL (ref 0.1–0.5)
Indirect Bilirubin: 0.5 mg/dL (ref 0.3–0.9)
TOTAL PROTEIN: 7.5 g/dL (ref 6.5–8.1)
Total Bilirubin: 0.7 mg/dL (ref 0.3–1.2)

## 2016-10-16 LAB — URINALYSIS COMPLETE WITH MICROSCOPIC (ARMC ONLY)
BACTERIA UA: NONE SEEN
BILIRUBIN URINE: NEGATIVE
Glucose, UA: NEGATIVE mg/dL
Hgb urine dipstick: NEGATIVE
Ketones, ur: NEGATIVE mg/dL
LEUKOCYTES UA: NEGATIVE
Nitrite: NEGATIVE
PH: 7 (ref 5.0–8.0)
PROTEIN: NEGATIVE mg/dL
RBC / HPF: NONE SEEN RBC/hpf (ref 0–5)
Specific Gravity, Urine: 1.004 — ABNORMAL LOW (ref 1.005–1.030)

## 2016-10-16 LAB — CBC
HEMATOCRIT: 41.8 % (ref 35.0–47.0)
HEMOGLOBIN: 14.3 g/dL (ref 12.0–16.0)
MCH: 29.4 pg (ref 26.0–34.0)
MCHC: 34.1 g/dL (ref 32.0–36.0)
MCV: 86.3 fL (ref 80.0–100.0)
Platelets: 268 10*3/uL (ref 150–440)
RBC: 4.85 MIL/uL (ref 3.80–5.20)
RDW: 12.7 % (ref 11.5–14.5)
WBC: 6.3 10*3/uL (ref 3.6–11.0)

## 2016-10-16 LAB — PREGNANCY, URINE: PREG TEST UR: NEGATIVE

## 2016-10-16 MED ORDER — OXYCODONE-ACETAMINOPHEN 7.5-325 MG PO TABS: 1.0000 | ORAL_TABLET | ORAL | 0 refills | Status: AC | PRN

## 2016-10-16 NOTE — Telephone Encounter (Signed)
Noted. Patient evaluated in the ED. Please call and check on patient on 10/17/16.

## 2016-10-16 NOTE — Discharge Instructions (Signed)
Return to the ED for fever or worsening symptoms   CT Findings   IMPRESSION:  Cholelithiasis without complicating factors.     Tiny nonobstructing right renal stone. No obstructive changes are  seen.

## 2016-10-16 NOTE — ED Provider Notes (Signed)
[facility] Emergency Department Provider Note   ____________________________________________   First MD Initiated Contact with Patient 10/16/16 1631     (approximate)  I have reviewed the triage vital signs and the nursing notes.   HISTORY  Chief Complaint Flank Pain   HPI Jaclyn Brandt is a 35 y.o. female that presents with sharp right sided flank pain since yesterday. Patient has felt nauseated all day. Patient states that she may have had a fever this morning. Patient has a history of gallstones. Patient has had several gallstone attacks in the past but this feels different. Pain is not worse with eating. Nothing makes the pain better or worse. Patient has 4 children and had tubes tied. Patient is concerned for ectopic pregnancy. Patient denies dysuria, frequency, urgency.    Past Medical History:  Diagnosis Date  . Hemorrhoids   . History of HPV infection   . Thyroid disease     Patient Active Problem List   Diagnosis Date Noted  . Acute upper respiratory infection 10/11/2015  . Preventative health care 03/07/2015  . Hernia of abdominal cavity 02/01/2015  . Hypothyroidism 01/24/2015  . Groin mass in female 01/19/2015    Past Surgical History:  Procedure Laterality Date  . HERNIA REPAIR Left 01/25/2015   Primary repair left femoral hernia.  Marland Kitchen. LIPOMA EXCISION Left 10/2004   left calf   . THYROIDECTOMY  03/2008  . TUBAL LIGATION  11/2013    Prior to Admission medications   Medication Sig Start Date End Date Taking? Authorizing Provider  levothyroxine (SYNTHROID, LEVOTHROID) 175 MCG tablet TAKE ONE TABLET BY MOUTH ONCE DAILY BEFORE BREAKFAST 09/02/16   Doreene NestKatherine K Clark, NP  oxyCODONE-acetaminophen (PERCOCET) 7.5-325 MG tablet Take 1 tablet by mouth every 4 (four) hours as needed for severe pain. 10/16/16 10/16/17  Enid DerryAshley Leonilda Cozby, PA-C    Allergies Patient has no known allergies.  Family History  Problem Relation Age of Onset  .  Hyperlipidemia Father   . Heart disease Paternal Grandfather     great aunt/breast  . Cancer Paternal Grandmother     breast  . Bipolar disorder Maternal Grandfather     Social History Social History  Substance Use Topics  . Smoking status: Former Smoker    Years: 5.00  . Smokeless tobacco: Never Used  . Alcohol use 0.0 oz/week    Review of Systems  Constitutional: No chills. Eyes: No visual changes. Cardiovascular: Denies chest pain. Respiratory: Denies shortness of breath. Gastrointestinal: No abdominal pain.  No vomiting.  Diarrhea last week.  No constipation. Genitourinary: Negative for dysuria. Increased frequency.  Musculoskeletal: Negative for muscle aches. No difficulty walking. Skin: Negative for rash. Neurological: Negative for headaches. Negative for weakness, numbness, tingling.     ____________________________________________   PHYSICAL EXAM:  VITAL SIGNS: ED Triage Vitals [10/16/16 1630]  Enc Vitals Group     BP 124/83     Pulse Rate 84     Resp 16     Temp 98.3 F (36.8 C)     Temp Source Oral     SpO2 100 %     Weight 165 lb (74.8 kg)     Height 5\' 8"  (1.727 m)     Head Circumference      Peak Flow      Pain Score 7     Pain Loc      Pain Edu?      Excl. in GC?     Constitutional: Alert and oriented. Well appearing  and in no acute distress. Eyes: Conjunctivae are normal. PERRL. EOMI. Head: Atraumatic. Nose: No congestion/rhinnorhea. Mouth/Throat: Mucous membranes are moist.  Cardiovascular: Normal rate, regular rhythm.  Good peripheral circulation. Respiratory: Normal respiratory effort.  No retractions. Lungs CTAB. Gastrointestinal: Soft and nontender. No distention. No abdominal bruits. CVA tenderness on right side. Genitourinary:  Musculoskeletal: No lower extremity tenderness nor edema.  No joint effusions. Neurologic:  Normal speech and language. No gross focal neurologic deficits are appreciated. No gait instability. Strength 5/5  in lower extremities. Sensation intact.  Skin:  Skin is warm, dry and intact. No rash noted. Psychiatric: Mood and affect are normal. Speech and behavior are normal.  ____________________________________________   LABS (all labs ordered are listed, but only abnormal results are displayed)  Labs Reviewed  URINALYSIS COMPLETEWITH MICROSCOPIC ([facility] ONLY) - Abnormal; Notable for the following:       Result Value   Color, Urine STRAW (*)    APPearance CLEAR (*)    Specific Gravity, Urine 1.004 (*)    Squamous Epithelial / LPF 0-5 (*)    All other components within normal limits  HEPATIC FUNCTION PANEL - Abnormal; Notable for the following:    AST 46 (*)    ALT 67 (*)    All other components within normal limits  CBC  BASIC METABOLIC PANEL  PREGNANCY, URINE   ____________________________________________  EKG  ____________________________________________  RADIOLOGY  IMPRESSION:  Cholelithiasis without complicating factors.    Tiny nonobstructing right renal stone. No obstructive changes are  seen.      PROCEDURES  Procedure(s) performed: No  Critical Care performed: No ____________________________________________   INITIAL IMPRESSION / ASSESSMENT AND PLAN / ED COURSE  Pertinent labs & imaging results that were available during my care of the patient were reviewed by me and considered in my medical decision making (see chart for details).   Clinical Course     Patient presented with right-sided mid back pain. CT shows nonobstructing gallstones and small kidney kidney stone on right side. Patient has a history of gallstones and was instructed to follow up with general surgery. Patient was instructed to follow up with urology for kidney stone. Patient was given Percocet for pain. Vital signs stable. Patient was instructed to follow-up with ED if symptoms worsen or persist. ____________________________________________   FINAL CLINICAL IMPRESSION(S) / ED  DIAGNOSES  Final diagnoses:  Kidney stone  Gallstones    NEW MEDICATIONS STARTED DURING THIS VISIT:  Discharge Medication List as of 10/16/2016  7:05 PM    START taking these medications   Details  oxyCODONE-acetaminophen (PERCOCET) 7.5-325 MG tablet Take 1 tablet by mouth every 4 (four) hours as needed for severe pain., Starting Wed 10/16/2016, Until Thu 10/16/2017, Print         Note:  This document was prepared using Dragon voice recognition software and may include unintentional dictation errors.   Enid DerryAshley Grady Mohabir, PA-C 10/16/16 2357    Sharman CheekPhillip Stafford, MD 10/19/16 2117

## 2016-10-16 NOTE — Telephone Encounter (Signed)
PLEASE NOTE: All timestamps contained within this report are represented as [place] Standard Time. CONFIDENTIALTY NOTICE: This fax transmission is intended only for the addressee. It contains information that is legally privileged, confidential or otherwise protected from use or disclosure. If you are not the intended recipient, you are strictly prohibited from reviewing, disclosing, copying using or disseminating any of this information or taking any action in reliance on or regarding this information. If you have received this fax in error, please notify us immediately by telephone so that we can arrange for its return to us. Phone: [phone], Toll-Free: [phone], Fax: [phone] Page: 1 of 2 Call Id: [id number] [facility] Client TELEPHONE ADVICE RECORD [facility] Patient Name: Jaclyn Brandt Gender: Female DOB: 1981-01-16 Age: [age] Y 4 M 4 D Return Phone Number: [phone] (Primary) Address: City/State/Zip: [place] Client [facility] Client [facility] Day Physician Vernona Riegerlark, Katherine - NP Contact Type Call Who Is Calling Patient / Member / Family / Caregiver Call Type Triage / Clinical Relationship To Patient Self Return Phone Number [phone] (Primary) Chief Complaint Back Pain - General Reason for Call Symptomatic / Request for Health Information Initial Comment Caller states c/o right side stabbing back pain. Appointment Disposition EMR Appointment Not Necessary Info pasted into Epic No PreDisposition Did not know what to do Translation No Nurse Assessment Nurse: Hammonds, RN, Alta CorningLissa Date/Time (Eastern Time): 10/16/2016 3:03:37 PM Confirm and document reason for call. If symptomatic, describe symptoms. You must click the next button to save text entered. ---Caller states: I haven't had a gall stone attack in a while but I am having right sided back pain.  This started last night and I thought it was back issues. But it feels like some one is sticking a knife in my back. No sleep because of it. Does the patient have any new or worsening symptoms? ---Yes Will a triage be completed? ---Yes Related visit to physician within the last 2 weeks? ---No Does the PT have any chronic conditions? (i.e. diabetes, asthma, etc.) ---Yes List chronic conditions. ---HX of gallstones Thyroid problem Joint pain. Is the patient pregnant or possibly pregnant? (Ask all females between the ages of 9512-55) ---No Is this a behavioral health or substance abuse call? ---No Guidelines Guideline Title Affirmed Question Affirmed Notes Nurse Date/Time (Eastern Time) Flank Pain [1] SEVERE pain (e.g., excruciating, scale 8-10) AND [2] present > 1 hour Hammonds, RN, Lissa 10/16/2016 3:08:15 PM Disp. Time Lamount Cohen(Eastern Time) Disposition Final User 10/16/2016 2:58:52 PM Send To Clinical Follow Up Darrick PennaQueue Green, Amy PLEASE NOTE: All timestamps contained within this report are represented as [place] Standard Time. CONFIDENTIALTY NOTICE: This fax transmission is intended only for the addressee. It contains information that is legally privileged, confidential or otherwise protected from use or disclosure. If you are not the intended recipient, you are strictly prohibited from reviewing, disclosing, copying using or disseminating any of this information or taking any action in reliance on or regarding this information. If you have received this fax in error, please notify us immediately by telephone so that we can arrange for its return to us. Phone: [phone], Toll-Free: [phone], Fax: [phone] Page: 2 of 2 Call Id: [id number] 10/16/2016 3:12:04 PM Go to ED Now Yes Hammonds, RN, Curly ShoresLissa Caller Understands: Yes Disagree/Comply: Comply Care Advice Given Per Guideline DRIVING: Another adult should drive. GO TO ED NOW: You need to be seen in the Emergency Department. Go  to the ER  at _________[facility] Hospital. Leave now. Drive carefully. BRING MEDICINES: CARE ADVICE given per Flank Pain (Adult) guideline. [facility]errals [facility] - ED

## 2016-10-16 NOTE — ED Triage Notes (Signed)
Pt c/o pain to right flank since last night. Denies fevers, hematuria, abdominal pain, or nausea/vomiting.

## 2016-10-17 NOTE — Telephone Encounter (Signed)
Patient stated at this point. She is okay, right now. She will left us know if gets worse or no improvement.

## 2016-10-17 NOTE — Telephone Encounter (Signed)
Noted  

## 2016-11-28 ENCOUNTER — Other Ambulatory Visit: Payer: Self-pay | Admitting: Primary Care

## 2016-11-28 DIAGNOSIS — E039 Hypothyroidism, unspecified: Secondary | ICD-10-CM

## 2016-11-28 NOTE — Telephone Encounter (Signed)
Ok to refill? Electronically refill request for   levothyroxine (SYNTHROID, LEVOTHROID) 175 MCG tablet  Last prescribed on 09/02/2016. Last seen on 02/07/2016

## 2016-11-29 NOTE — Telephone Encounter (Signed)
Pt was notified. She said she's aware that she needs to come back for a lab check but right now she's having money issues and needs to find out how much an TSH lab will cost?  Okey DupreRose can you help me find out the price?

## 2016-12-02 NOTE — Telephone Encounter (Signed)
Left mssg on v/m informing that TSH for a self pay pt w/out ins would be approximately $35.

## 2017-02-16 ENCOUNTER — Encounter: Payer: Self-pay | Admitting: Emergency Medicine

## 2017-02-16 DIAGNOSIS — K8050 Calculus of bile duct without cholangitis or cholecystitis without obstruction: Secondary | ICD-10-CM | POA: Insufficient documentation

## 2017-02-16 DIAGNOSIS — Z87891 Personal history of nicotine dependence: Secondary | ICD-10-CM | POA: Insufficient documentation

## 2017-02-16 DIAGNOSIS — E039 Hypothyroidism, unspecified: Secondary | ICD-10-CM | POA: Insufficient documentation

## 2017-02-16 MED ORDER — IBUPROFEN 400 MG PO TABS
400.0000 mg | ORAL_TABLET | Freq: Once | ORAL | Status: AC | PRN
  Administered 2017-02-17: 400 mg via ORAL
  Filled 2017-02-16: qty 1

## 2017-02-16 MED ORDER — ONDANSETRON 4 MG PO TBDP
4.0000 mg | ORAL_TABLET | Freq: Once | ORAL | Status: AC | PRN
  Administered 2017-02-17: 4 mg via ORAL
  Filled 2017-02-16: qty 1

## 2017-02-16 NOTE — ED Triage Notes (Signed)
Patient states that she has gallstones and having a "gallbladder attack". Patient states that she is having upper abdominal pain and nausea that started around 19:00. Patient states that she took tylenol with minimal relief. Patient states that she was unable to follow up with a surgeon because she has no insurance.

## 2017-02-17 ENCOUNTER — Emergency Department
Admission: EM | Admit: 2017-02-17 | Discharge: 2017-02-17 | Disposition: A | Discharge status: Home / Self Care | Payer: Self-pay | Attending: Emergency Medicine | Admitting: Emergency Medicine

## 2017-02-17 ENCOUNTER — Telehealth: Payer: Self-pay | Admitting: *Deleted

## 2017-02-17 ENCOUNTER — Encounter: Payer: Self-pay | Admitting: Emergency Medicine

## 2017-02-17 ENCOUNTER — Emergency Department: Payer: Self-pay

## 2017-02-17 DIAGNOSIS — R109 Unspecified abdominal pain: Secondary | ICD-10-CM

## 2017-02-17 DIAGNOSIS — K8050 Calculus of bile duct without cholangitis or cholecystitis without obstruction: Secondary | ICD-10-CM

## 2017-02-17 DIAGNOSIS — R1013 Epigastric pain: Secondary | ICD-10-CM

## 2017-02-17 LAB — CBC
HCT: 40.8 % (ref 35.0–47.0)
Hemoglobin: 13.8 g/dL (ref 12.0–16.0)
MCH: 29.6 pg (ref 26.0–34.0)
MCHC: 33.7 g/dL (ref 32.0–36.0)
MCV: 87.9 fL (ref 80.0–100.0)
Platelets: 312 10*3/uL (ref 150–440)
RBC: 4.65 MIL/uL (ref 3.80–5.20)
RDW: 12.9 % (ref 11.5–14.5)
WBC: 7.6 10*3/uL (ref 3.6–11.0)

## 2017-02-17 LAB — COMPREHENSIVE METABOLIC PANEL
ALBUMIN: 4.6 g/dL (ref 3.5–5.0)
ALK PHOS: 82 U/L (ref 38–126)
ALT: 18 U/L (ref 14–54)
AST: 22 U/L (ref 15–41)
Anion gap: 7 (ref 5–15)
BUN: 15 mg/dL (ref 6–20)
CALCIUM: 9.5 mg/dL (ref 8.9–10.3)
CO2: 29 mmol/L (ref 22–32)
CREATININE: 0.48 mg/dL (ref 0.44–1.00)
Chloride: 102 mmol/L (ref 101–111)
GFR calc non Af Amer: >60 mL/min (ref >60)
GLUCOSE: 93 mg/dL (ref 65–99)
Potassium: 3.9 mmol/L (ref 3.5–5.1)
Sodium: 138 mmol/L (ref 135–145)
Total Bilirubin: 0.7 mg/dL (ref 0.3–1.2)
Total Protein: 7.8 g/dL (ref 6.5–8.1)

## 2017-02-17 LAB — URINALYSIS, COMPLETE (UACMP) WITH MICROSCOPIC
Bilirubin Urine: NEGATIVE
GLUCOSE, UA: NEGATIVE mg/dL
HGB URINE DIPSTICK: NEGATIVE
Ketones, ur: NEGATIVE mg/dL
Leukocytes, UA: NEGATIVE
Nitrite: NEGATIVE
PROTEIN: NEGATIVE mg/dL
RBC / HPF: NONE SEEN RBC/hpf (ref 0–5)
Specific Gravity, Urine: 1.01 (ref 1.005–1.030)
Squamous Epithelial / LPF: NONE SEEN
pH: 7 (ref 5.0–8.0)

## 2017-02-17 LAB — LIPASE, BLOOD: Lipase: 16 U/L (ref 11–51)

## 2017-02-17 LAB — TROPONIN I: Troponin I: <0.03 ng/mL (ref <0.03)

## 2017-02-17 MED ORDER — TRAMADOL HCL 50 MG PO TABS: 50.0000 mg | ORAL_TABLET | Freq: Four times a day (QID) | ORAL | 0 refills | Status: DC | PRN

## 2017-02-17 MED ORDER — ONDANSETRON 4 MG PO TBDP: 4.0000 mg | ORAL_TABLET | Freq: Three times a day (TID) | ORAL | 0 refills | Status: DC | PRN

## 2017-02-17 MED ORDER — OXYCODONE-ACETAMINOPHEN 5-325 MG PO TABS
1.0000 | ORAL_TABLET | Freq: Once | ORAL | Status: AC
  Administered 2017-02-17: 1 via ORAL
  Filled 2017-02-17: qty 1

## 2017-02-17 NOTE — ED Notes (Signed)
Patient transported to Ultrasound 

## 2017-02-17 NOTE — ED Notes (Signed)
Patient reports having history of gallbladder problems but has not followed up due to not having insurance.  Reports its has been all long time since it has bothered her.  Reports tonight was the worse pain and that was why she came in.  Reports was nauseated with the pain.  Reports pain in now better (4/10).

## 2017-02-17 NOTE — Discharge Instructions (Signed)
Please follow-up with surgery. You will continue to have this pain until you have your gallbladder removed. Please return with any worsening symptoms.

## 2017-02-17 NOTE — Telephone Encounter (Signed)
Noted, patient seen in ED today. Chart reviewed.

## 2017-02-17 NOTE — ED Notes (Signed)
Returned to room.

## 2017-02-17 NOTE — ED Notes (Signed)
E-signature box not working. Pt verbalized understanding of discharge instructions and denied questions. 

## 2017-02-17 NOTE — ED Provider Notes (Signed)
[facility] Emergency Department Provider Note   ____________________________________________   First MD Initiated Contact with Patient 02/17/17 [phone]     (approximate)  I have reviewed the triage vital signs and the nursing notes.   HISTORY  Chief Complaint Abdominal Pain and Nausea    HPI Jaclyn Brandt is a 36 y.o. female who comes into the hospital today with a gallbladder attack. She reports that she thought she was running a fever for a while but she didn't check her temperature she just felt hot. She was in excruciating abdominal pain. She reports that she couldn't touch her stomach and she couldn't bend over because she was having some mid upper abdominal pain. The patient had some nausea with no vomiting. She has had many gallbladder attacks in the past. This one started around 7 PM. It lasted until after she arrived here. She reports that she still has some intermittent pain and rates her pain a 6 out of 10 in intensity currently. The patient was not sure what else was going and she decided to come into the hospital today for evaluation of her pain.   Past Medical History:  Diagnosis Date  . Hemorrhoids   . History of HPV infection   . Thyroid disease     Patient Active Problem List   Diagnosis Date Noted  . Acute upper respiratory infection 10/11/2015  . Preventative health care 03/07/2015  . Hernia of abdominal cavity 02/01/2015  . Hypothyroidism 01/24/2015  . Groin mass in female 01/19/2015    Past Surgical History:  Procedure Laterality Date  . HERNIA REPAIR Left 01/25/2015   Primary repair left femoral hernia.  Marland Kitchen LIPOMA EXCISION Left 10/2004   left calf   . THYROIDECTOMY  03/2008  . TUBAL LIGATION  11/2013    Prior to Admission medications   Medication Sig Start Date End Date Taking? Authorizing Provider  levothyroxine (SYNTHROID, LEVOTHROID) 175 MCG tablet TAKE ONE TABLET BY MOUTH ONCE DAILY BEFORE BREAKFAST 11/28/16  Yes  Doreene Nest, NP  ondansetron (ZOFRAN ODT) 4 MG disintegrating tablet Take 1 tablet (4 mg total) by mouth every 8 (eight) hours as needed for nausea or vomiting. 02/17/17   Rebecka Apley, MD  oxyCODONE-acetaminophen (PERCOCET) 7.5-325 MG tablet Take 1 tablet by mouth every 4 (four) hours as needed for severe pain. 10/16/16 10/16/17  Enid Derry, PA-C  traMADol (ULTRAM) 50 MG tablet Take 1 tablet (50 mg total) by mouth every 6 (six) hours as needed. 02/17/17   Rebecka Apley, MD    Allergies Patient has no known allergies.  Family History  Problem Relation Age of Onset  . Hyperlipidemia Father   . Heart disease Paternal Grandfather     great aunt/breast  . Cancer Paternal Grandmother     breast  . Bipolar disorder Maternal Grandfather     Social History Social History  Substance Use Topics  . Smoking status: Former Smoker    Years: 5.00  . Smokeless tobacco: Never Used  . Alcohol use No    Review of Systems Constitutional: No fever/chills Eyes: No visual changes. ENT: No sore throat. Cardiovascular: Denies chest pain. Respiratory: Denies shortness of breath. Gastrointestinal:  abdominal pain.   nausea, no vomiting.  No diarrhea.  No constipation. Genitourinary: Negative for dysuria. Musculoskeletal: Negative for back pain. Skin: Negative for rash. Neurological: Negative for headaches, focal weakness or numbness.  10-point ROS otherwise negative.  ____________________________________________   PHYSICAL EXAM:  VITAL SIGNS: ED Triage Vitals  Enc Vitals Group     BP 02/16/17 2353 (!) 158/64     Pulse Rate 02/16/17 2352 74     Resp 02/16/17 2352 18     Temp 02/16/17 2352 97.7 F (36.5 C)     Temp Source 02/16/17 2352 Oral     SpO2 02/16/17 2352 98 %     Weight 02/16/17 2352 165 lb (74.8 kg)     Height 02/16/17 2352  (1.727 m)     Head Circumference --      Peak Flow --      Pain Score 02/16/17 2352 8     Pain Loc --      Pain Edu? --      Excl.  in GC? --     Constitutional: Alert and oriented. Well appearing and in mild distress. Eyes: Conjunctivae are normal. PERRL. EOMI. Head: Atraumatic. Nose: No congestion/rhinnorhea. Mouth/Throat: Mucous membranes are moist.  Oropharynx non-erythematous. Cardiovascular: Normal rate, regular rhythm. Grossly normal heart sounds.  Good peripheral circulation. Respiratory: Normal respiratory effort.  No retractions. Lungs CTAB. Gastrointestinal: Soft With some mild epigastric tenderness to palpation. No distention. Positive bowel sounds Musculoskeletal: No lower extremity tenderness nor edema.   Neurologic:  Normal speech and language.  Skin:  Skin is warm, dry and intact.  Psychiatric: Mood and affect are normal.   ____________________________________________   LABS (all labs ordered are listed, but only abnormal results are displayed)  Labs Reviewed  URINALYSIS, COMPLETE (UACMP) WITH MICROSCOPIC - Abnormal; Notable for the following:       Result Value   Color, Urine STRAW (*)    APPearance CLEAR (*)    Bacteria, UA RARE (*)    All other components within normal limits  LIPASE, BLOOD  COMPREHENSIVE METABOLIC PANEL  CBC  TROPONIN I   ____________________________________________  EKG  none ____________________________________________  RADIOLOGY  [place] and RUQ ____________________________________________   PROCEDURES  Procedure(s) performed: None  Procedures  Critical Care performed: No  ____________________________________________   INITIAL IMPRESSION / ASSESSMENT AND PLAN / ED COURSE  Pertinent labs & imaging results that were available during my care of the patient were reviewed by me and considered in my medical decision making (see chart for details).  This is a 36 year old female who comes into the hospital today with some epigastric pain. The patient has a history of gallstones and is worried that this is a gallbladder attack. I sent the patient for an  ultrasound while she does have some stones she does not have any findings of acute cholecystitis. I did give the patient some Percocet prior to her being seen. I will encourage the patient again to follow-up with surgery as she does need to have her gallbladder out given the frequency of her attacks. The patient otherwise has no acute distress and will be discharged to home. Her blood work is unremarkable.  Clinical Course as of Feb 17 737  Mon Feb 17, 2017  0622 Cholelithiasis without sonographic findings of acute cholecystitis, however sonographic Murphy's sign not assessed due to medicated patient.   US Abdomen Limited RUQ [AW]    Clinical Course User Index [AW] Rebecka Apley, MD     ____________________________________________   FINAL CLINICAL IMPRESSION(S) / ED DIAGNOSES  Final diagnoses:  Abdominal pain  Biliary colic  Epigastric pain      NEW MEDICATIONS STARTED DURING THIS VISIT:  New Prescriptions   ONDANSETRON (ZOFRAN ODT) 4 MG DISINTEGRATING TABLET    Take 1 tablet (4 mg total)  by mouth every 8 (eight) hours as needed for nausea or vomiting.   TRAMADOL (ULTRAM) 50 MG TABLET    Take 1 tablet (50 mg total) by mouth every 6 (six) hours as needed.     Note:  This document was prepared using Dragon voice recognition software and may include unintentional dictation errors.    Rebecka Apley, MD 02/17/17 [phone]

## 2017-02-17 NOTE — Telephone Encounter (Signed)
NOTE: All timestamps contained within this report are represented as [place] Standard Time. CONFIDENTIALTY NOTICE: This fax transmission is intended only for the addressee. It contains information that is legally privileged, confidential or otherwise protected from use or disclosure. If you are not the intended recipient, you are strictly prohibited from reviewing, disclosing, copying using or disseminating any of this information or taking any action in reliance on or regarding this information. If you have received this fax in error, please notify us immediately by telephone so that we can arrange for its return to [place]. Phone: [phone], Toll-Free: [phone], Fax: [phone] Page: 1 of 1 Call Id: [id number] [facility] Night - Client TELEPHONE ADVICE RECORD [facility] Patient Name: Jaclyn Brandt Gender: Female DOB: 1981/07/14 Age: 36 Y 8 M 8 D Return Phone Number: [phone] (Primary) Address: [address] City/State/Zip: [place] [address] Client [facility] Night - Client Client Site  [facility] [place] - Night Physician Vernona Rieger - NP Who Is Calling Patient / Member / Family / Caregiver Call Type Triage / Clinical Relationship To Patient Self Return Phone Number [phone] (Primary) Chief Complaint ABDOMINAL PAIN - Severe and only in abdomen Reason for Call Symptomatic / Request for Health Information Initial Comment Caller states she is having sharp severe abdominal pain. It hurts when caller breathes. Nurse Assessment Nurse: Ladona Ridgel, RN, Nedra Hai Date/Time Lamount Cohen Time): 02/16/2017 10:47:34 PM Confirm and document reason for call. If symptomatic, describe symptoms. ---Caller states she is having sharp severe upper abdominal pain that started about 7 pm. It hurts worse in the back when caller breathes. Had fever earlier. HX of gallbladder. No vomiting but is nauseated. Does  the PT have any chronic conditions? (i.e. diabetes, asthma, etc.) ---Yes List chronic conditions. ---thyroid hx of gallbladder but no surgery Is the patient pregnant or possibly pregnant? (Ask all females between the ages of 64-55) ---No Guidelines Guideline Title Affirmed Question Abdominal Pain - Upper [1] SEVERE pain (e.g., excruciating) AND [2] present > 1 hour Disp. Time Lamount Cohen Time) Disposition Final User 02/16/2017 10:52:45 PM Go to ED Now Yes Ladona Ridgel, RN, Nedra Hai Referrals [facility] - ED Care Advice Given Per Guideline GO TO ED NOW: You need to be seen in the Emergency Department. Go to the ER at ___________ Hospital. Leave now. Drive carefully. DRIVING: Another adult should drive. Do not delay going to the Emergency Department. If immediate transportation is not available via car or taxi, then the patient should be instructed to call [id number]. NOTHING BY MOUTH: Do not eat or drink anything for now. (Reason: condition may need surgery and general anesthesia.) CARE ADVICE given per Abdominal Pain, Upper (Adult) guideline.

## 2017-02-22 ENCOUNTER — Other Ambulatory Visit: Payer: Self-pay | Admitting: Primary Care

## 2017-02-22 DIAGNOSIS — E039 Hypothyroidism, unspecified: Secondary | ICD-10-CM

## 2017-02-24 NOTE — Telephone Encounter (Signed)
Ok to refill? Electronically refill request for levothyroxine (SYNTHROID, LEVOTHROID) 175 MCG tablet. Last prescribed on 11/28/2016. Last seen on 02/07/2016.

## 2017-02-24 NOTE — Telephone Encounter (Signed)
Needs office visit for any further refills as we've not seen her in over 1 year. Please schedule.

## 2017-02-24 NOTE — Telephone Encounter (Addendum)
Noted. Please tell patient that it was a pleasure knowing her and that we wish her the best.

## 2017-02-24 NOTE — Telephone Encounter (Signed)
Spoke to pt who states she will be moving back to [facility] this weekend and the refill was to only last until she can be seen by her new provider. Pt will put in request to have records sent

## 2021-11-08 ENCOUNTER — Other Ambulatory Visit: Payer: Self-pay

## 2021-11-08 ENCOUNTER — Encounter: Payer: Self-pay | Admitting: Family Medicine

## 2021-11-08 ENCOUNTER — Ambulatory Visit: Payer: BC Managed Care – PPO | Admitting: Family Medicine

## 2021-11-08 VITALS — BP 113/69 | HR 81 | Ht 68.0 in | Wt 168.4 lb

## 2021-11-08 DIAGNOSIS — M172 Bilateral post-traumatic osteoarthritis of knee: Secondary | ICD-10-CM

## 2021-11-08 DIAGNOSIS — E890 Postprocedural hypothyroidism: Secondary | ICD-10-CM | POA: Diagnosis not present

## 2021-11-08 DIAGNOSIS — Z7689 Persons encountering health services in other specified circumstances: Secondary | ICD-10-CM

## 2021-11-08 DIAGNOSIS — N63 Unspecified lump in unspecified breast: Secondary | ICD-10-CM

## 2021-11-08 DIAGNOSIS — N644 Mastodynia: Secondary | ICD-10-CM | POA: Diagnosis not present

## 2021-11-08 NOTE — Assessment & Plan Note (Signed)
Stable chronic problem Controlled on levothyroxine daily She has enough for 2 weeks left of current bottle Check TSH T4 today, adjust if indicated - or refill Levothyroxine daily 90 day

## 2021-11-08 NOTE — Progress Notes (Signed)
Subjective:    Patient ID: Jaclyn Brandt, female    DOB: Mar 06, 1981, 40 y.o.   MRN: [id number]  Jaclyn Brandt is a 40 y.o. female presenting on 11/08/2021 for Establish Care  Previously with PCP Vernona Rieger NP 2017 last visit.   HPI  S/p Cholecystectomy While in [place], had removal of gallbladder 2018  Left Breast Cyst Symptoms with some pain associated with pain at mid upper portion central breast, question if cyst or fibrocystic symptoms, she said onset around 2019 they were going to look into before pandemic, basically on exam and with symptoms only assoc with menstrual cycle. Still same scenario now 3 years later, feels achy only with menstrual cycle.  Hypothyroidism Currently doing well. Chronic problem, post-operative hypothyroidism. Taking Levothyroxine daily, no issues, has 2 week left of med, needs lab and refills soon. She admits some increased tired and achy symptoms if running low on medicine.  Left Knee Pain History of post traumatic osteoarthritis 6 month ago History of injury with fall on a rock in driveway She had a localized laceration injury, swollen and ecchymosis, has since healed, no medication attention. She has crepitus sounds. She has history of prior Left knee dislocation and fracture as a teenager, and she had issue with R knee due to compensation to avoid her bad knee the L knee.   No flowsheet data found.  Past Medical History:  Diagnosis Date   Hemorrhoids    History of HPV infection    Thyroid disease    Past Surgical History:  Procedure Laterality Date   HERNIA REPAIR Left 01/25/2015   Primary repair left femoral hernia.   LIPOMA EXCISION Left 10/2004   left calf    THYROIDECTOMY  03/2008   TUBAL LIGATION  11/2013   Social History   Socioeconomic History   Marital status: Married    Spouse name: Not on file   Number of children: Not on file   Years of education: Not on file   Highest education level: Not on file   Occupational History   Not on file  Tobacco Use   Smoking status: Former    Years: 5.00    Types: Cigarettes   Smokeless tobacco: Never  Substance and Sexual Activity   Alcohol use: No    Alcohol/week: 0.0 standard drinks   Drug use: No   Sexual activity: Not on file  Other Topics Concern   Not on file  Social History Narrative   Married   Has 4 children, (3 boys 7 years and 14 months), 66 girl- 39 years old   Live in [place]   Enjoys going out with her husband, hiking.   Social Determinants of Health   Financial Resource Strain: Not on file  Food Insecurity: Not on file  Transportation Needs: Not on file  Physical Activity: Not on file  Stress: Not on file  Social Connections: Not on file  Intimate Partner Violence: Not on file   Family History  Problem Relation Age of Onset   Hyperlipidemia Father    Heart disease Paternal Grandfather        great aunt/breast   Cancer Paternal Grandmother        breast   Bipolar disorder Maternal Grandfather    Current Outpatient Medications on File Prior to Visit  Medication Sig   levothyroxine (SYNTHROID) 200 MCG tablet Take 1 tablet (200 mcg total) by mouth daily before breakfast.   No current facility-administered medications on file prior to visit.  Review of Systems Per HPI unless specifically indicated above      Objective:    BP 113/69    Pulse 81    Ht 5\' 8"  (1.727 m)    Wt 168 lb 6.4 oz (76.4 kg)    SpO2 100%    BMI 25.61 kg/m   Wt Readings from Last 3 Encounters:  11/08/21 168 lb 6.4 oz (76.4 kg)  02/16/17 165 lb (74.8 kg)  10/16/16 165 lb (74.8 kg)    Physical Exam Vitals and nursing note reviewed.  Constitutional:      General: She is not in acute distress.    Appearance: Normal appearance. She is well-developed. She is not diaphoretic.     Comments: Well-appearing, comfortable, cooperative  HENT:     Head: Normocephalic and atraumatic.  Eyes:     General:        Right eye: No discharge.         Left eye: No discharge.     Conjunctiva/sclera: Conjunctivae normal.  Cardiovascular:     Rate and Rhythm: Normal rate.  Pulmonary:     Effort: Pulmonary effort is normal.  Musculoskeletal:     Comments: Bilateral knees with notable crepitus on exam. Some minor deformity left knee but appears otherwise normal.  Skin:    General: Skin is warm and dry.     Findings: No erythema or rash.  Neurological:     Mental Status: She is alert and oriented to person, place, and time.  Psychiatric:        Mood and Affect: Mood normal.        Behavior: Behavior normal.        Thought Content: Thought content normal.     Comments: Well groomed, good eye contact, normal speech and thoughts   Results for orders placed or performed during the hospital encounter of 02/17/17  Lipase, blood  Result Value Ref Range   Lipase 16 11 - 51 U/L  Comprehensive metabolic panel  Result Value Ref Range   Sodium 138 135 - 145 mmol/L   Potassium 3.9 3.5 - 5.1 mmol/L   Chloride 102 101 - 111 mmol/L   CO2 29 22 - 32 mmol/L   Glucose, Bld 93 65 - 99 mg/dL   BUN 15 6 - 20 mg/dL   Creatinine, Ser 04/19/17 0.44 - 1.00 mg/dL   Calcium 9.5 8.9 - 1.61 mg/dL   Total Protein 7.8 6.5 - 8.1 g/dL   Albumin 4.6 3.5 - 5.0 g/dL   AST 22 15 - 41 U/L   ALT 18 14 - 54 U/L   Alkaline Phosphatase 82 38 - 126 U/L   Total Bilirubin 0.7 0.3 - 1.2 mg/dL   GFR calc non Af Amer >60 >60 mL/min   GFR calc Af Amer >60 >60 mL/min   Anion gap 7 5 - 15  CBC  Result Value Ref Range   WBC 7.6 3.6 - 11.0 K/uL   RBC 4.65 3.80 - 5.20 MIL/uL   Hemoglobin 13.8 12.0 - 16.0 g/dL   HCT 09.6 04.5 - 40.9 %   MCV 87.9 80.0 - 100.0 fL   MCH 29.6 26.0 - 34.0 pg   MCHC 33.7 32.0 - 36.0 g/dL   RDW 81.1 91.4 - 78.2 %   Platelets 312 150 - 440 K/uL  Urinalysis, Complete w Microscopic  Result Value Ref Range   Color, Urine STRAW (A) YELLOW   APPearance CLEAR (A) CLEAR   Specific Gravity, Urine 1.010 1.005 -  1.030   pH 7.0 5.0 - 8.0   Glucose, UA  NEGATIVE NEGATIVE mg/dL   Hgb urine dipstick NEGATIVE NEGATIVE   Bilirubin Urine NEGATIVE NEGATIVE   Ketones, ur NEGATIVE NEGATIVE mg/dL   Protein, ur NEGATIVE NEGATIVE mg/dL   Nitrite NEGATIVE NEGATIVE   Leukocytes, UA NEGATIVE NEGATIVE   RBC / HPF NONE SEEN 0 - 5 RBC/hpf   WBC, UA 0-5 0 - 5 WBC/hpf   Bacteria, UA RARE (A) NONE SEEN   Squamous Epithelial / LPF NONE SEEN NONE SEEN   Mucus PRESENT   Troponin I  Result Value Ref Range   Troponin I <0.03 <0.03 ng/mL      Assessment & Plan:   Problem List Items Addressed This Visit     Post-traumatic osteoarthritis of both knees   Hypothyroidism - Primary    Stable chronic problem Controlled on levothyroxine daily She has enough for 2 weeks left of current bottle Check TSH T4 today, adjust if indicated - or refill Levothyroxine daily 90 day      Relevant Medications   levothyroxine (SYNTHROID) 200 MCG tablet   Other Relevant Orders   TSH   T4, free   Other Visit Diagnoses     Encounter to establish care with new doctor       Breast nodule       Relevant Orders   MM DIAG BREAST TOMO BILATERAL   US BREAST LTD UNI LEFT INC AXILLA   Breast pain, left       Relevant Orders   MM DIAG BREAST TOMO BILATERAL   US BREAST LTD UNI LEFT INC AXILLA       #Suspected OA/DJD bilateral knees Likely post-traumatic due to prior history Reassurance with history and exam Start conservative therapy topical diclofenac nsaid, knee sleeves, modify activity F/u in future, consider X-rays  #Breast nodule, cystic / Left Recurrent problem >3 years since 2019 with menstrual triggered breast symptoms localized issue, prior evaluation, recommended [place] eval Reassurance likely fibrocystic etiology and assoc with menstrual  - Will proceed w/ Diagnostic Mammo and Left Breast US - orderd to [facility], she will call to schedule once processed. Follow up as needed.   Orders Placed This Encounter  Procedures   MM DIAG BREAST TOMO  BILATERAL    Standing Status:   Future    Standing Expiration Date:   05/09/2022    Order Specific Question:   Reason for Exam (SYMPTOM  OR DIAGNOSIS REQUIRED)    Answer:   3 years of episodic menstrual related left central upper 12 o clock cystic structure    Order Specific Question:   Preferred imaging location?    Answer:   [facility]   US BREAST LTD UNI LEFT INC AXILLA    Standing Status:   Future    Standing Expiration Date:   05/09/2022    Order Specific Question:   Reason for Exam (SYMPTOM  OR DIAGNOSIS REQUIRED)    Answer:   3 years of episodic menstrual related left central upper 12 o clock cystic structure    Order Specific Question:   Preferred imaging location?    Answer:   [facility]   TSH   T4, free     No orders of the defined types were placed in this encounter.     Follow up plan: Return in about 6 months (around 05/09/2022) for 6 month Annual Physical and fasting lab after AM.  TSH T4  Saralyn Pilar, DO [facility] 11/08/2021, 10:24 AM

## 2021-11-08 NOTE — Patient Instructions (Addendum)
Thank you for coming to the office today.  Thyroid labs today, stay tuned for results. Including mychart.  If normal - we will refill your medicine levothyroxine 200 - if range is off, will contact you first. If you run out before the refill, please let me know.   For Mammogram screening for breast cancer   Call the Imaging Center below anytime to schedule your own appointment now that order has been placed.  [facility] [address] [place], [place] [address] Phone: [phone]    START anti inflammatory topical - OTC Voltaren (generic Diclofenac) topical 2-4 times a day as needed for pain swelling of affected joint for 1-2 weeks or longer.  Use RICE therapy: - R - Rest / relative rest with activity modification avoid overuse of joint - I - Ice packs (make sure you use a towel or sock / something to protect skin) - C - Compression with flexible Knee Sleeve / ACE wrap to apply pressure and reduce swelling allowing more support - E - Elevation - if significant swelling, lift leg above heart level (toes above your nose) to help reduce swelling, most helpful at night after day of being on your feet  DUE for FASTING BLOOD WORK (no food or drink after midnight before the lab appointment, only water or coffee without cream/sugar on the morning of)  SCHEDULE "Lab Only" visit in the morning at the clinic for lab draw in 6 MONTHS   - Make sure Lab Only appointment is at about 1 week before your next appointment, so that results will be available  For Lab Results, once available within 2-3 days of blood draw, you can can log in to MyChart online to view your results and a brief explanation. Also, we can discuss results at next follow-up visit.   Please schedule a Follow-up Appointment to: Return in about 6 months (around 05/09/2022) for 6 month Annual Physical and fasting lab after AM.  If you have any other questions or concerns,  please feel free to call the office or send a message through MyChart. You may also schedule an earlier appointment if necessary.  Additionally, you may be receiving a survey about your experience at our office within a few days to 1 week by e-mail or mail. We value your feedback.  Saralyn Pilar, DO [facility], [place]

## 2021-11-09 LAB — T4, FREE: Free T4: 1.8 ng/dL (ref 0.8–1.8)

## 2021-11-09 LAB — TSH: TSH: 0.21 mIU/L — ABNORMAL LOW

## 2021-11-09 MED ORDER — LEVOTHYROXINE SODIUM 175 MCG PO TABS: 175.0000 ug | ORAL_TABLET | Freq: Every day | ORAL | 3 refills | Status: DC

## 2021-11-09 NOTE — Addendum Note (Signed)
Addended by: Smitty Cords on: 11/09/2021 12:41 PM   Modules accepted: Orders

## 2021-11-23 ENCOUNTER — Ambulatory Visit
Admission: RE | Admit: 2021-11-23 | Discharge: 2021-11-23 | Disposition: A | Discharge status: Home / Self Care | Payer: BC Managed Care – PPO | Source: Ambulatory Visit | Attending: Family Medicine | Admitting: Family Medicine

## 2021-11-23 ENCOUNTER — Other Ambulatory Visit: Payer: Self-pay | Admitting: Family Medicine

## 2021-11-23 ENCOUNTER — Other Ambulatory Visit: Payer: Self-pay

## 2021-11-23 DIAGNOSIS — R928 Other abnormal and inconclusive findings on diagnostic imaging of breast: Secondary | ICD-10-CM

## 2021-11-23 DIAGNOSIS — N644 Mastodynia: Secondary | ICD-10-CM | POA: Insufficient documentation

## 2021-11-23 DIAGNOSIS — N63 Unspecified lump in unspecified breast: Secondary | ICD-10-CM | POA: Insufficient documentation

## 2022-05-16 ENCOUNTER — Ambulatory Visit (INDEPENDENT_AMBULATORY_CARE_PROVIDER_SITE_OTHER): Payer: BC Managed Care – PPO | Admitting: Family Medicine

## 2022-05-16 ENCOUNTER — Encounter: Payer: Self-pay | Admitting: Family Medicine

## 2022-05-16 ENCOUNTER — Other Ambulatory Visit: Payer: Self-pay | Admitting: Family Medicine

## 2022-05-16 ENCOUNTER — Telehealth: Payer: Self-pay | Admitting: Obstetrics & Gynecology

## 2022-05-16 VITALS — BP 105/59 | HR 70 | Ht 68.0 in | Wt 170.2 lb

## 2022-05-16 DIAGNOSIS — Z124 Encounter for screening for malignant neoplasm of cervix: Secondary | ICD-10-CM | POA: Diagnosis not present

## 2022-05-16 DIAGNOSIS — E7800 Pure hypercholesterolemia, unspecified: Secondary | ICD-10-CM

## 2022-05-16 DIAGNOSIS — Z131 Encounter for screening for diabetes mellitus: Secondary | ICD-10-CM

## 2022-05-16 DIAGNOSIS — B078 Other viral warts: Secondary | ICD-10-CM

## 2022-05-16 DIAGNOSIS — M172 Bilateral post-traumatic osteoarthritis of knee: Secondary | ICD-10-CM | POA: Diagnosis not present

## 2022-05-16 DIAGNOSIS — Z0000 Encounter for general adult medical examination without abnormal findings: Secondary | ICD-10-CM | POA: Diagnosis not present

## 2022-05-16 DIAGNOSIS — E890 Postprocedural hypothyroidism: Secondary | ICD-10-CM

## 2022-05-16 NOTE — Telephone Encounter (Signed)
Jaclyn Brandt referring for  pap smear. HX of abnormal pap result. Called and left voicemail for patient to call back to be scheduled.

## 2022-05-16 NOTE — Patient Instructions (Addendum)
Thank you for coming to the office today.  Keep on current Levothyroxine daily, no dose change at this time, pending lab result.  You have refills of levothyroxine for 6 more months through 10/2022  [facility] [address], [address] [place], [place] [address] Hours: Lewayne Bunting Main: [phone]  [facility]   Address: [address] [place], [place] [address], [place], [place] [address] Hours: 8AM-5PM Phone: [phone]  Stay tuned for apt / call from GYN for pap smear screening.  For L Knee  START anti inflammatory topical - OTC Voltaren (generic Diclofenac) topical 2-4 times a day as needed for pain swelling of affected joint for 1-2 weeks or longer.  Fasting lab today, stay tuned for results.  DUE for FASTING BLOOD WORK (no food or drink after midnight before the lab appointment, only water or coffee without cream/sugar on the morning of)  SCHEDULE "Lab Only" visit in the morning at the clinic for lab draw in 1 YEAR  - Make sure Lab Only appointment is at about 1 week before your next appointment, so that results will be available  For Lab Results, once available within 2-3 days of blood draw, you can can log in to MyChart online to view your results and a brief explanation. Also, we can discuss results at next follow-up visit.   Please schedule a Follow-up Appointment to: Return in about 1 year (around 05/17/2023) for 1 year fasting lab only then 1 week later Annual Physical.  If you have any other questions or concerns, please feel free to call the office or send a message through MyChart. You may also schedule an earlier appointment if necessary.  Additionally, you may be receiving a survey about your experience at our office within a few days to 1 week by e-mail or mail. We value your feedback.  Saralyn Pilar, DO [facility], [place]

## 2022-05-16 NOTE — Progress Notes (Signed)
Subjective:    Patient ID: Jaclyn Brandt, female    DOB: [birth year], 41 y.o.   MRN: [id number]  Jaclyn Brandt is a 41 y.o. female presenting on 05/16/2022 for Annual Exam   HPI  Here for Annual Physical and Lab Review  BMI >25 Diet - balanced diet Lifestyle - staying active, walking nightly with her kids 30 min nightly, stays active with work and family, also swimming in pool  History of Left Breast Cyst She has had prior diagnostic mammo / L breast US done 11/2021, benign now repeat 1 year 11/2022. See past history HPI   Hypothyroidism Currently doing well. Chronic problem, post-operative hypothyroidism. Previously 10/2021 had mild low TSH 0.21 and high normal Free T4 1.8 Dose was reduced from Levothyroxine down to 175 mcg daily Taking Levothyroxine daily and doing well she does not feel any significant difference   Left Knee Pain History of post traumatic osteoarthritis History of injury with fall on a rock in driveway She had a localized laceration injury, swollen and ecchymosis, has since healed, no medication attention. She has crepitus sounds. She has history of prior Left knee dislocation and fracture as a teenager, and she had issue with R knee due to compensation to avoid her bad knee the L knee. Has episodic flares worse with weather   Health Maintenance:  Declines TDap, COVID Booster  Declines Hep C and HIV screening.     05/16/2022    8:17 AM  Depression screen PHQ 2/9  Decreased Interest 0  Down, Depressed, Hopeless 0  PHQ - 2 Score 0  Altered sleeping 0  Tired, decreased energy 0  Change in appetite 0  Feeling bad or failure about yourself  0  Trouble concentrating 0  Moving slowly or fidgety/restless 0  Suicidal thoughts 0  PHQ-9 Score 0  Difficult doing work/chores Not difficult at all    Past Medical History:  Diagnosis Date   Hemorrhoids    History of HPV infection    Thyroid disease    Past Surgical History:   Procedure Laterality Date   HERNIA REPAIR Left 01/25/2015   Primary repair left femoral hernia.   LIPOMA EXCISION Left 10/2004   left calf    THYROIDECTOMY  03/2008   TUBAL LIGATION  11/2013   Social History   Socioeconomic History   Marital status: Married    Spouse name: Not on file   Number of children: Not on file   Years of education: Not on file   Highest education level: Not on file  Occupational History   Not on file  Tobacco Use   Smoking status: Former    Years: 5.00    Types: Cigarettes   Smokeless tobacco: Never  Substance and Sexual Activity   Alcohol use: No    Alcohol/week: 0.0 standard drinks of alcohol   Drug use: No   Sexual activity: Not on file  Other Topics Concern   Not on file  Social History Narrative   Married   Has 4 children, (3 boys 7 years and 14 months), 76 girl- 40 years old   Live in [place]   Enjoys going out with her husband, hiking.   Social Determinants of Health   Financial Resource Strain: Not on file  Food Insecurity: Not on file  Transportation Needs: Not on file  Physical Activity: Not on file  Stress: Not on file  Social Connections: Not on file  Intimate Partner Violence: Not on file   Family History  Problem Relation Age of Onset   Hyperlipidemia Father    Bipolar disorder Maternal Grandfather    Breast cancer Paternal Grandmother        ? age   Cancer Paternal Grandmother        breast   Heart disease Paternal Grandfather        great aunt/breast   Diabetes Neg Hx    Current Outpatient Medications on File Prior to Visit  Medication Sig   levothyroxine (SYNTHROID) 175 MCG tablet Take 1 tablet (175 mcg total) by mouth daily.   No current facility-administered medications on file prior to visit.    Review of Systems  Constitutional:  Negative for activity change, appetite change, chills, diaphoresis, fatigue and fever.  HENT:  Negative for congestion and hearing loss.   Eyes:  Negative for visual disturbance.   Respiratory:  Negative for cough, chest tightness, shortness of breath and wheezing.   Cardiovascular:  Negative for chest pain, palpitations and leg swelling.  Gastrointestinal:  Negative for abdominal pain, constipation, diarrhea, nausea and vomiting.  Genitourinary:  Negative for dysuria, frequency and hematuria.  Musculoskeletal:  Negative for arthralgias and neck pain.  Skin:  Negative for rash.  Neurological:  Negative for dizziness, weakness, light-headedness, numbness and headaches.  Hematological:  Negative for adenopathy.  Psychiatric/Behavioral:  Negative for behavioral problems, dysphoric mood and sleep disturbance.    Per HPI unless specifically indicated above      Objective:    BP (!) 105/59   Pulse 70   Ht 5\' 8"  (1.727 m)   Wt 170 lb 3.2 oz (77.2 kg)   SpO2 100%   BMI 25.88 kg/m   Wt Readings from Last 3 Encounters:  05/16/22 170 lb 3.2 oz (77.2 kg)  11/08/21 168 lb 6.4 oz (76.4 kg)  02/16/17 165 lb (74.8 kg)    Physical Exam Vitals and nursing note reviewed.  Constitutional:      General: She is not in acute distress.    Appearance: She is well-developed. She is not diaphoretic.     Comments: Well-appearing, comfortable, cooperative  HENT:     Head: Normocephalic and atraumatic.     Right Ear: Tympanic membrane, ear canal and external ear normal. There is no impacted cerumen.     Left Ear: Tympanic membrane, ear canal and external ear normal. There is no impacted cerumen.  Eyes:     General:        Right eye: No discharge.        Left eye: No discharge.     Conjunctiva/sclera: Conjunctivae normal.     Pupils: Pupils are equal, round, and reactive to light.  Neck:     Thyroid: No thyromegaly.  Cardiovascular:     Rate and Rhythm: Normal rate and regular rhythm.     Pulses: Normal pulses.     Heart sounds: Normal heart sounds. No murmur heard. Pulmonary:     Effort: Pulmonary effort is normal. No respiratory distress.     Breath sounds: Normal  breath sounds. No wheezing or rales.  Abdominal:     General: Bowel sounds are normal. There is no distension.     Palpations: Abdomen is soft. There is no mass.     Tenderness: There is no abdominal tenderness.  Musculoskeletal:        General: No tenderness. Normal range of motion.     Cervical back: Normal range of motion and neck supple.     Right lower leg: No edema.  Left lower leg: No edema.     Comments: Upper / Lower Extremities: - Normal muscle tone, strength bilateral upper extremities 5/5, lower extremities 5/5  Lymphadenopathy:     Cervical: No cervical adenopathy.  Skin:    General: Skin is warm and dry.     Findings: No erythema or rash.  Neurological:     Mental Status: She is alert and oriented to person, place, and time.     Comments: Distal sensation intact to light touch all extremities  Psychiatric:        Mood and Affect: Mood normal.        Behavior: Behavior normal.        Thought Content: Thought content normal.     Comments: Well groomed, good eye contact, normal speech and thoughts     MM DIAG BREAST TOMO BILATERAL [id number] Resulted: 11/23/21 1105  Order Status: Completed Updated: 11/23/21 1305  Narrative:    CLINICAL DATA:  Three years of episodic menstrual related left  central upper firmness/lump.   EXAM:  DIGITAL DIAGNOSTIC BILATERAL MAMMOGRAM WITH TOMOSYNTHESIS AND CAD;  ULTRASOUND RIGHT BREAST LIMITED; ULTRASOUND LEFT BREAST LIMITED   TECHNIQUE:  Bilateral digital diagnostic mammography and breast tomosynthesis  was performed. The images were evaluated with computer-aided  detection.; Targeted ultrasound examination of the right breast was  performed; Targeted ultrasound examination of the left breast was  performed.   COMPARISON:  None.   ACR Breast Density Category c: The breast tissue is heterogeneously  dense, which may obscure small masses.   FINDINGS:  An asymmetry in the medial right breast improves on spot imaging but   does not completely resolve. This is located superiorly based on 3D  imaging. An asymmetry in this region on the MLO view resolves on  spot imaging. No other suspicious findings are seen in either  breast.   Targeted ultrasound is performed, showing a cluster cyst at 2  o'clock in the right breast correlating with the right breast  asymmetry. An island of glandular tissue seen in the region of the  patient's left-sided symptoms. No suspicious masses in either  breast.   IMPRESSION:  No mammographic or sonographic evidence of malignancy. Fibrocystic  changes on the right.   RECOMMENDATION:  Annual screening mammography.   I have discussed the findings and recommendations with the patient.  If applicable, a reminder letter will be sent to the patient  regarding the next appointment.   BI-RADS CATEGORY  2: Benign.    Electronically Signed    By: Dorise Bullion III M.D.    On: 11/23/2021 11:05     Results for orders placed or performed in visit on 11/08/21  TSH  Result Value Ref Range   TSH 0.21 (L) mIU/L  T4, free  Result Value Ref Range   Free T4 1.8 0.8 - 1.8 ng/dL      Assessment & Plan:   Problem List Items Addressed This Visit     Post-traumatic osteoarthritis of both knees   Hypothyroidism   Relevant Orders   COMPLETE METABOLIC PANEL WITH GFR   CBC with Differential/Platelet   TSH   T4, free   Other Visit Diagnoses     Annual physical exam    -  Primary   Relevant Orders   COMPLETE METABOLIC PANEL WITH GFR   CBC with Differential/Platelet   Lipid panel   Hemoglobin A1c   TSH   T4, free   Cervical cancer screening  Relevant Orders   Ambulatory referral to Obstetrics / Gynecology   Screening for diabetes mellitus (DM)       Relevant Orders   Hemoglobin A1c   Elevated LDL cholesterol level       Relevant Orders   COMPLETE METABOLIC PANEL WITH GFR   CBC with Differential/Platelet   Lipid panel   TSH   T4, free       Updated Health  Maintenance information Fasting lab only today Encouraged improvement to lifestyle with diet and exercise Goal of weight loss  Hypothyroidism Post surgical Controlled Keep on current Levothyroxine 168mcg daily, no dose change at this time, pending lab result.  You have refills of levothyroxine for 6 more months through 10/2022  Hx Abnormal Pap / Cervical CA Screening Referral today Last pap done out of state in [place] >2-3 year ago  [facility]   Address: [address], [place], [place], [place], [place] [address] Hours: 8AM-5PM Phone: [phone]  For L Knee START anti inflammatory topical - OTC Voltaren (generic Diclofenac) topical 2-4 times a day as needed for pain swelling of affected joint for 1-2 weeks or longer.  No orders of the defined types were placed in this encounter.   Follow up plan: Return in about 1 year (around 05/17/2023) for 1 year fasting lab only then 1 week later Annual Physical.  Fasting labs today.  Nobie Putnam, [facility] 05/16/2022, 8:22 AM

## 2022-05-17 LAB — LIPID PANEL
Cholesterol: 246 mg/dL — ABNORMAL HIGH (ref <200)
HDL: 64 mg/dL (ref >=50)
LDL Cholesterol (Calc): 160 mg/dL (calc) — ABNORMAL HIGH
Non-HDL Cholesterol (Calc): 182 mg/dL (calc) — ABNORMAL HIGH (ref <130)
Total CHOL/HDL Ratio: 3.8 (calc) (ref <5.0)
Triglycerides: 107 mg/dL (ref <150)

## 2022-05-17 LAB — COMPLETE METABOLIC PANEL WITH GFR
AG Ratio: 1.7 (calc) (ref 1.0–2.5)
ALT: 46 U/L — ABNORMAL HIGH (ref 6–29)
AST: 28 U/L (ref 10–30)
Albumin: 4.3 g/dL (ref 3.6–5.1)
Alkaline phosphatase (APISO): 66 U/L (ref 31–125)
BUN: 14 mg/dL (ref 7–25)
CO2: 28 mmol/L (ref 20–32)
Calcium: 9.2 mg/dL (ref 8.6–10.2)
Chloride: 101 mmol/L (ref 98–110)
Creat: 0.61 mg/dL (ref 0.50–0.97)
Globulin: 2.5 g/dL (calc) (ref 1.9–3.7)
Glucose, Bld: 72 mg/dL (ref 65–99)
Potassium: 4 mmol/L (ref 3.5–5.3)
Sodium: 138 mmol/L (ref 135–146)
Total Bilirubin: 0.8 mg/dL (ref 0.2–1.2)
Total Protein: 6.8 g/dL (ref 6.1–8.1)
eGFR: 117 mL/min/{1.73_m2} (ref >=60)

## 2022-05-17 LAB — CBC WITH DIFFERENTIAL/PLATELET
Absolute Monocytes: 312 cells/uL (ref 200–950)
Basophils Absolute: 8 cells/uL (ref 0–200)
Basophils Relative: 0.2 %
Eosinophils Absolute: 41 cells/uL (ref 15–500)
Eosinophils Relative: 1 %
HCT: 41.4 % (ref 35.0–45.0)
Hemoglobin: 13.8 g/dL (ref 11.7–15.5)
Lymphs Abs: 984 cells/uL (ref 850–3900)
MCH: 30.3 pg (ref 27.0–33.0)
MCHC: 33.3 g/dL (ref 32.0–36.0)
MCV: 91 fL (ref 80.0–100.0)
MPV: 9.9 fL (ref 7.5–12.5)
Monocytes Relative: 7.6 %
Neutro Abs: 2755 cells/uL (ref 1500–7800)
Neutrophils Relative %: 67.2 %
Platelets: 288 10*3/uL (ref 140–400)
RBC: 4.55 10*6/uL (ref 3.80–5.10)
RDW: 11.9 % (ref 11.0–15.0)
Total Lymphocyte: 24 %
WBC: 4.1 10*3/uL (ref 3.8–10.8)

## 2022-05-17 LAB — T4, FREE: Free T4: 1.6 ng/dL (ref 0.8–1.8)

## 2022-05-17 LAB — TSH: TSH: 0.15 mIU/L — ABNORMAL LOW

## 2022-05-17 LAB — HEMOGLOBIN A1C
Hgb A1c MFr Bld: 4.9 % of total Hgb (ref <5.7)
Mean Plasma Glucose: 94 mg/dL
eAG (mmol/L): 5.2 mmol/L

## 2022-05-20 NOTE — Telephone Encounter (Signed)
Called and left voicemail for patient to call back to be scheduled. 

## 2022-05-24 ENCOUNTER — Encounter: Payer: Self-pay | Admitting: Family Medicine

## 2022-05-24 NOTE — Telephone Encounter (Signed)
Call cant be completed as file. No answer. Sending letter via mail to contact patient about multiple attempts to reach via phone being successful.

## 2022-10-16 IMAGING — MG DIGITAL DIAGNOSTIC BILAT W/ TOMO W/ CAD
8 of 14 series · 8 of 40 positions shown · non-contrast
Comparison: None.

CLINICAL DATA: Three years of episodic menstrual related left
central upper firmness/lump.



[L CC synth-2D]
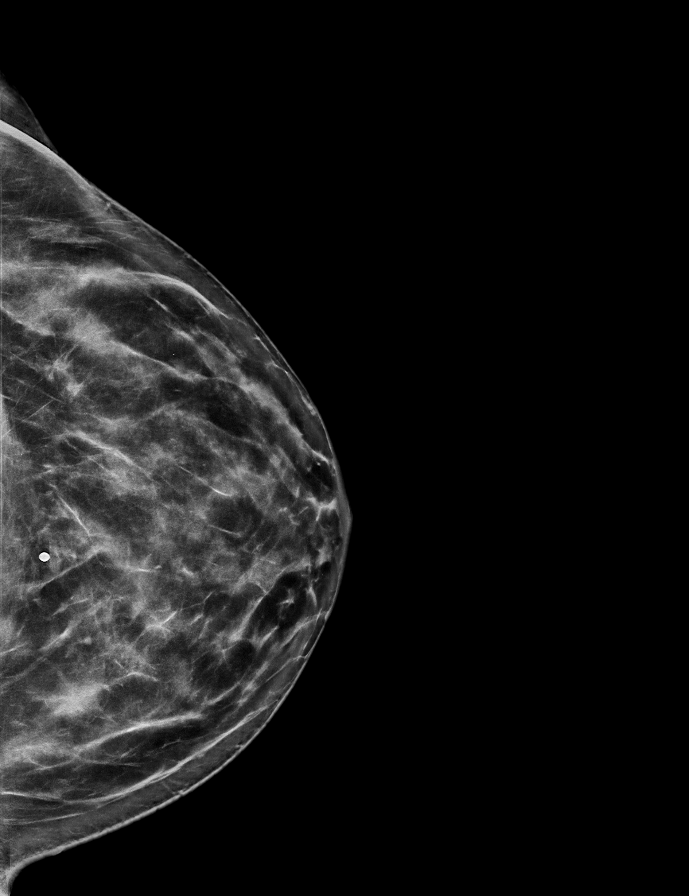

[L TAN synth-2D]
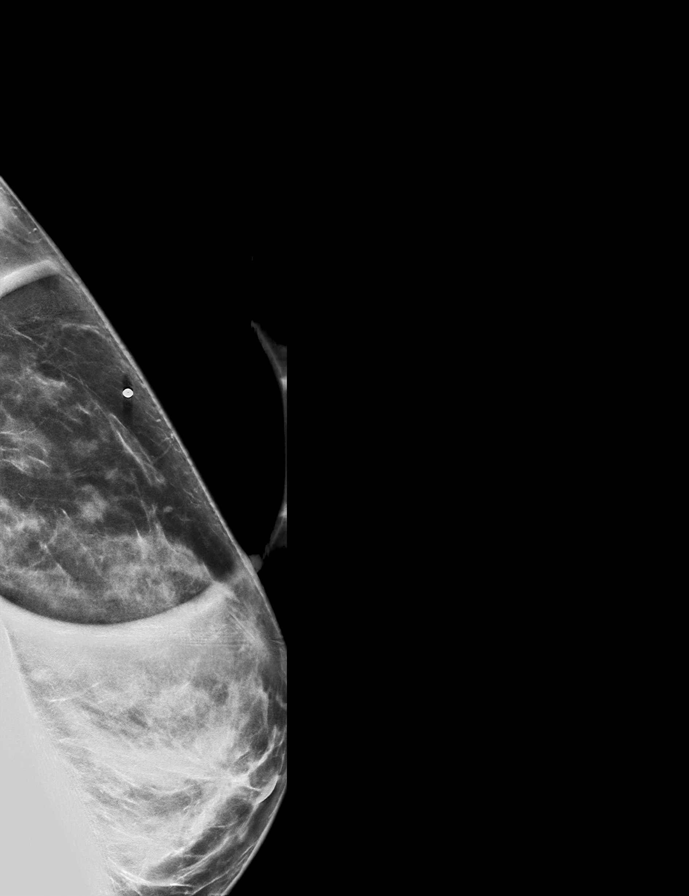

[R MLO synth-2D (1 of 2)]
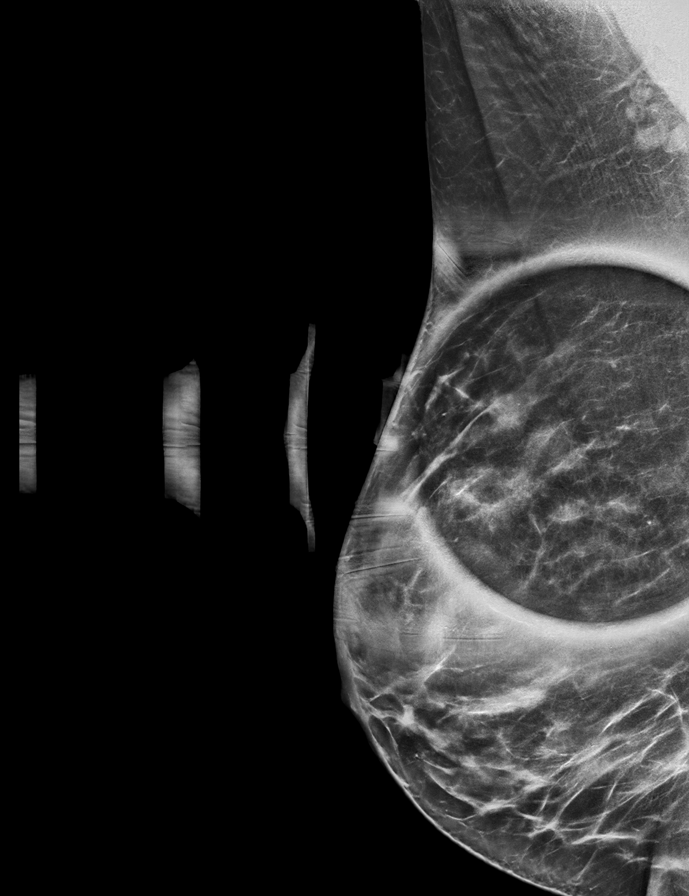

[R MLO synth-2D (2 of 2)]
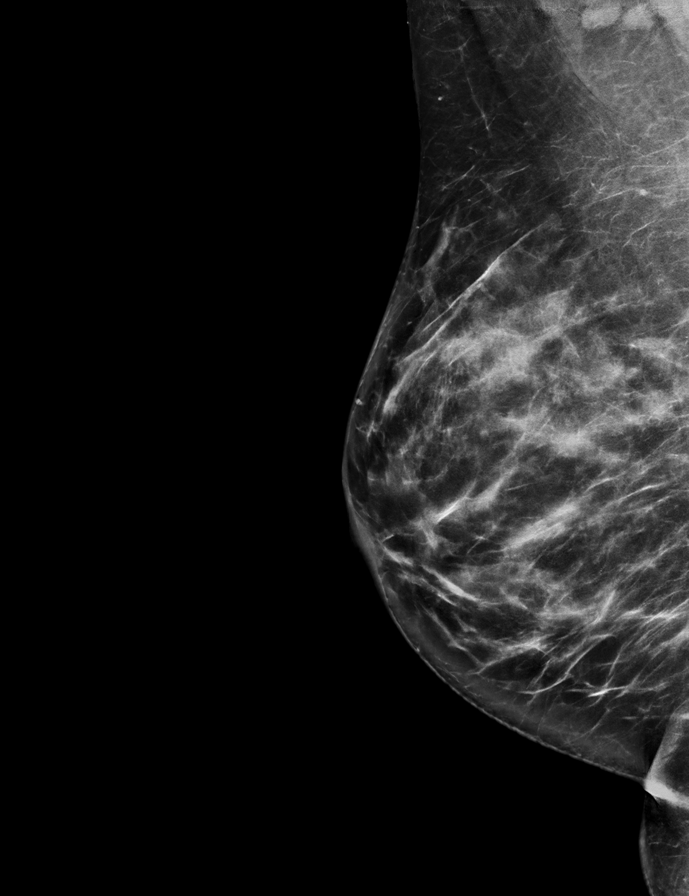

[R CC synth-2D (1 of 2)]
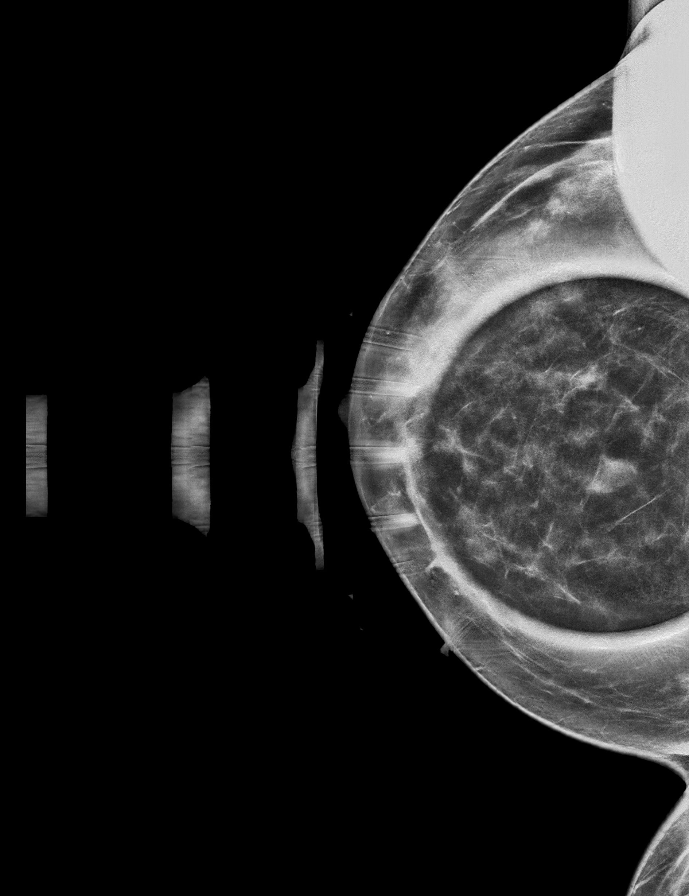

[R CC synth-2D (2 of 2)]
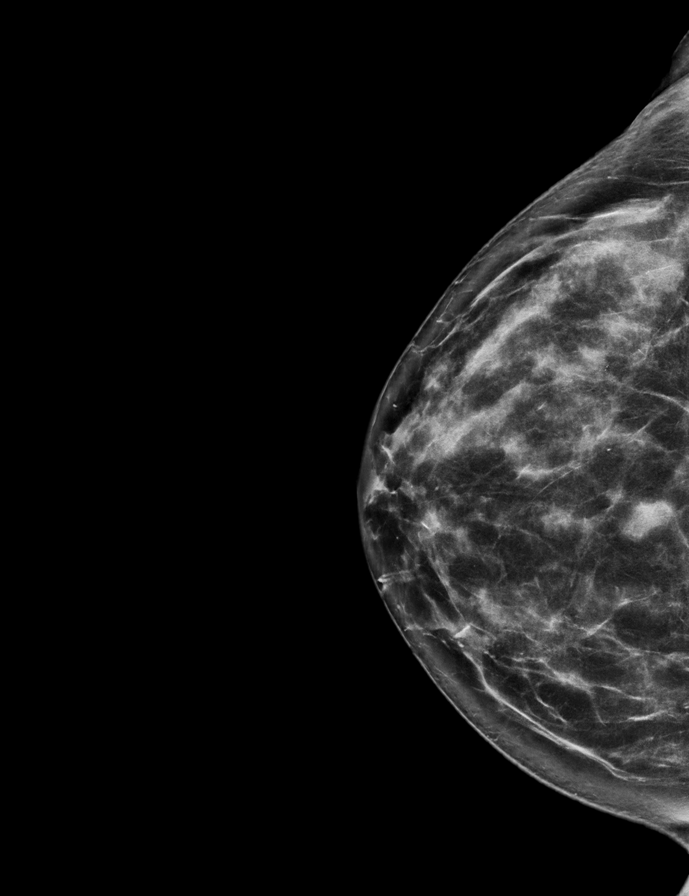

[L MLO synth-2D]
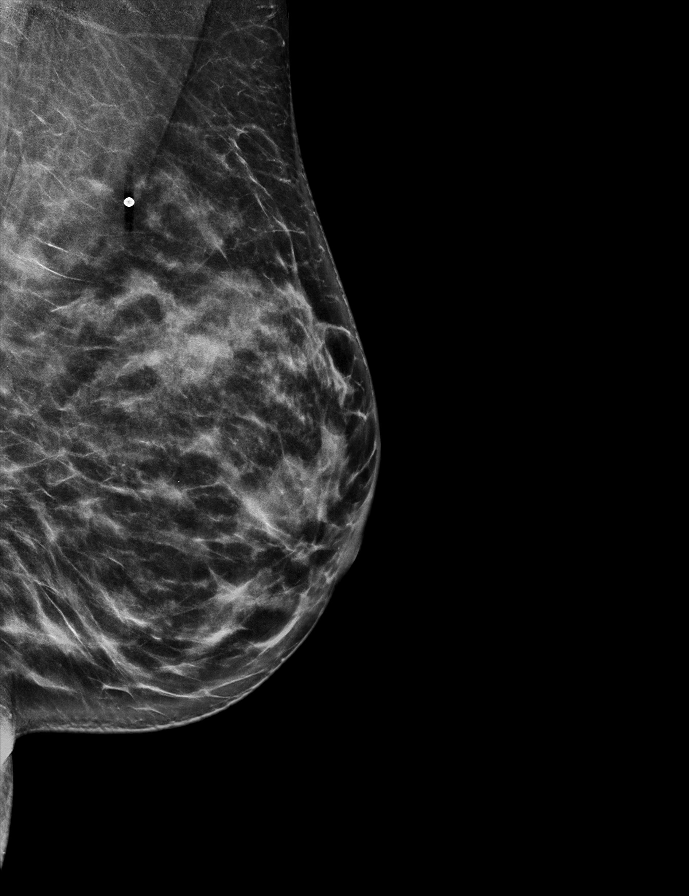

[R MLO tomo · tomo slice 39/78.0]
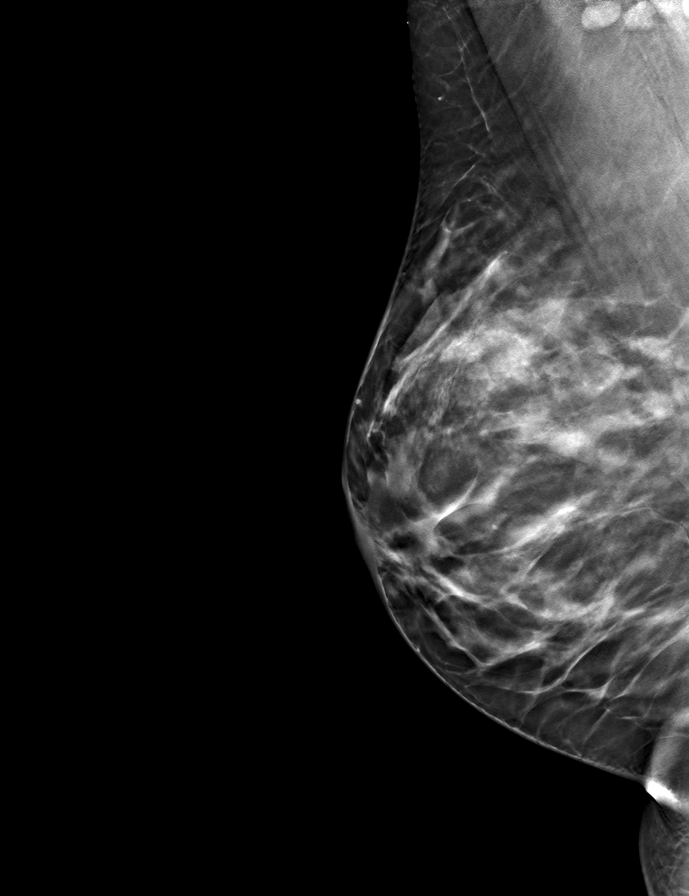

[8 of 40 positions shown; findings below may reference images not displayed]

ACR Breast Density Category c: The breast tissue is heterogeneously
dense, which may obscure small masses.
FINDINGS: An asymmetry in the medial right breast improves on spot imaging but
does not completely resolve. This is located superiorly based on 3D
imaging. An asymmetry in this region on the MLO view resolves on
spot imaging. No other suspicious findings are seen in either
breast.

Targeted ultrasound is performed, showing a cluster cyst at 2
o'clock in the right breast correlating with the right breast
asymmetry. An island of glandular tissue seen in the region of the
patient's left-sided symptoms. No suspicious masses in either
breast.
IMPRESSION: No mammographic or sonographic evidence of malignancy. Fibrocystic
changes on the right.

RECOMMENDATION:
Annual screening mammography.

I have discussed the findings and recommendations with the patient.
If applicable, a reminder letter will be sent to the patient
regarding the next appointment.

BI-RADS CATEGORY  2: Benign.

## 2022-11-11 ENCOUNTER — Other Ambulatory Visit: Payer: Self-pay | Admitting: Family Medicine

## 2022-11-11 DIAGNOSIS — E890 Postprocedural hypothyroidism: Secondary | ICD-10-CM

## 2022-11-14 NOTE — Telephone Encounter (Signed)
Requested Prescriptions  Pending Prescriptions Disp Refills   levothyroxine (SYNTHROID) 175 MCG tablet [Pharmacy Med Name: LEVOTHYROXINE 175 MCG TABLET] 90 tablet 3    Sig: TAKE 1 TABLET BY MOUTH EVERY DAY     Endocrinology:  Hypothyroid Agents Failed - 11/11/2022  2:11 AM      Failed - TSH in normal range and within 360 days    TSH  Date Value Ref Range Status  05/16/2022 0.15 (L) mIU/L Final    Comment:              Reference Range .           > or = 20 Years  0.40-4.50 .                Pregnancy Ranges           First trimester    0.26-2.66           Second trimester   0.55-2.73           Third trimester    0.43-2.91          Passed - Valid encounter within last 12 months    Recent Outpatient Visits           6 months ago Annual physical exam   [facility] Smitty Cords, DO   1 year ago Postoperative hypothyroidism   [facility] Althea Charon Netta Neat, DO       Future Appointments             In 1 week Deirdre Evener, MD [facility]   In 6 months Althea Charon, Netta Neat, DO [facility], [facility]

## 2022-11-25 ENCOUNTER — Ambulatory Visit: Payer: BC Managed Care – PPO | Admitting: Dermatology

## 2023-05-12 ENCOUNTER — Other Ambulatory Visit: Payer: Self-pay | Admitting: Family Medicine

## 2023-05-12 DIAGNOSIS — E890 Postprocedural hypothyroidism: Secondary | ICD-10-CM

## 2023-05-13 NOTE — Telephone Encounter (Signed)
Requested medications are due for refill today.  yes  Requested medications are on the active medications list.  yes  Last refill. 11/14/2022 #90 1 rf  Future visit scheduled.   yes  Notes to clinic.  Abnormal labs.    Requested Prescriptions  Pending Prescriptions Disp Refills   levothyroxine (SYNTHROID) 175 MCG tablet [Pharmacy Med Name: LEVOTHYROXINE 175 MCG TABLET] 90 tablet 1    Sig: TAKE 1 TABLET BY MOUTH EVERY DAY     Endocrinology:  Hypothyroid Agents Failed - 05/12/2023  2:43 AM      Failed - TSH in normal range and within 360 days    TSH  Date Value Ref Range Status  05/16/2022 0.15 (L) mIU/L Final    Comment:              Reference Range .           > or = 20 Years  0.40-4.50 .                Pregnancy Ranges           First trimester    0.26-2.66           Second trimester   0.55-2.73           Third trimester    0.43-2.91          Failed - Valid encounter within last 12 months    Recent Outpatient Visits           12 months ago Annual physical exam   [facility] Smitty Cords, DO   1 year ago Postoperative hypothyroidism   [facility], Netta Neat, [place]

## 2023-05-26 ENCOUNTER — Other Ambulatory Visit: Payer: BC Managed Care – PPO

## 2023-06-02 ENCOUNTER — Ambulatory Visit: Payer: BC Managed Care – PPO | Admitting: Family Medicine

## 2023-06-10 ENCOUNTER — Other Ambulatory Visit: Payer: Self-pay

## 2023-06-10 DIAGNOSIS — Z0000 Encounter for general adult medical examination without abnormal findings: Secondary | ICD-10-CM

## 2023-06-10 DIAGNOSIS — E890 Postprocedural hypothyroidism: Secondary | ICD-10-CM

## 2023-06-10 DIAGNOSIS — E7800 Pure hypercholesterolemia, unspecified: Secondary | ICD-10-CM

## 2023-06-11 ENCOUNTER — Other Ambulatory Visit: Payer: BC Managed Care – PPO

## 2023-06-11 DIAGNOSIS — E890 Postprocedural hypothyroidism: Secondary | ICD-10-CM

## 2023-06-11 DIAGNOSIS — Z0000 Encounter for general adult medical examination without abnormal findings: Secondary | ICD-10-CM

## 2023-06-11 DIAGNOSIS — E7800 Pure hypercholesterolemia, unspecified: Secondary | ICD-10-CM

## 2023-06-11 LAB — CBC WITH DIFFERENTIAL/PLATELET
Absolute Monocytes: 326 cells/uL (ref 200–950)
Basophils Absolute: 20 cells/uL (ref 0–200)
Basophils Relative: 0.4 %
Eosinophils Absolute: 61 cells/uL (ref 15–500)
Eosinophils Relative: 1.2 %
HCT: 41 % (ref 35.0–45.0)
Hemoglobin: 13.5 g/dL (ref 11.7–15.5)
Monocytes Relative: 6.4 %
Neutrophils Relative %: 68.6 %
Platelets: 313 10*3/uL (ref 140–400)
RBC: 4.5 10*6/uL (ref 3.80–5.10)
Total Lymphocyte: 23.4 %
WBC: 5.1 10*3/uL (ref 3.8–10.8)

## 2023-06-12 LAB — CBC WITH DIFFERENTIAL/PLATELET
Lymphs Abs: 1193 cells/uL (ref 850–3900)
MCH: 30 pg (ref 27.0–33.0)
MCHC: 32.9 g/dL (ref 32.0–36.0)
MCV: 91.1 fL (ref 80.0–100.0)
MPV: 10.2 fL (ref 7.5–12.5)
Neutro Abs: 3499 cells/uL (ref 1500–7800)
RDW: 11.9 % (ref 11.0–15.0)

## 2023-06-12 LAB — HEMOGLOBIN A1C
Mean Plasma Glucose: 97 mg/dL
eAG (mmol/L): 5.4 mmol/L

## 2023-06-12 LAB — LIPID PANEL
Cholesterol: 227 mg/dL — ABNORMAL HIGH (ref <200)
HDL: 58 mg/dL (ref >=50)
Total CHOL/HDL Ratio: 3.9 (calc) (ref <5.0)
Triglycerides: 160 mg/dL — ABNORMAL HIGH (ref <150)

## 2023-06-12 LAB — T4, FREE: Free T4: 1.7 ng/dL (ref 0.8–1.8)

## 2023-06-12 LAB — COMPLETE METABOLIC PANEL WITH GFR
AG Ratio: 1.9 (calc) (ref 1.0–2.5)
ALT: 15 U/L (ref 6–29)
AST: 15 U/L (ref 10–30)
Albumin: 4.5 g/dL (ref 3.6–5.1)
Alkaline phosphatase (APISO): 61 U/L (ref 31–125)
BUN: 12 mg/dL (ref 7–25)
CO2: 28 mmol/L (ref 20–32)
Calcium: 9.1 mg/dL (ref 8.6–10.2)
Chloride: 102 mmol/L (ref 98–110)
Creat: 0.63 mg/dL (ref 0.50–0.99)
Globulin: 2.4 g/dL (calc) (ref 1.9–3.7)
Glucose, Bld: 85 mg/dL (ref 65–99)
Potassium: 3.9 mmol/L (ref 3.5–5.3)
Sodium: 139 mmol/L (ref 135–146)
Total Bilirubin: 0.8 mg/dL (ref 0.2–1.2)
Total Protein: 6.9 g/dL (ref 6.1–8.1)
eGFR: 115 mL/min/{1.73_m2} (ref >=60)

## 2023-06-12 LAB — TSH: TSH: 0.23 mIU/L — ABNORMAL LOW

## 2023-06-18 ENCOUNTER — Encounter: Payer: BC Managed Care – PPO | Admitting: Family Medicine

## 2023-07-29 ENCOUNTER — Ambulatory Visit (INDEPENDENT_AMBULATORY_CARE_PROVIDER_SITE_OTHER): Payer: BC Managed Care – PPO | Admitting: Family Medicine

## 2023-07-29 VITALS — BP 106/68 | HR 80 | Ht 66.25 in | Wt 173.0 lb

## 2023-07-29 DIAGNOSIS — Z1231 Encounter for screening mammogram for malignant neoplasm of breast: Secondary | ICD-10-CM | POA: Diagnosis not present

## 2023-07-29 DIAGNOSIS — E890 Postprocedural hypothyroidism: Secondary | ICD-10-CM

## 2023-07-29 DIAGNOSIS — K644 Residual hemorrhoidal skin tags: Secondary | ICD-10-CM

## 2023-07-29 DIAGNOSIS — Z0000 Encounter for general adult medical examination without abnormal findings: Secondary | ICD-10-CM | POA: Diagnosis not present

## 2023-07-29 DIAGNOSIS — Z124 Encounter for screening for malignant neoplasm of cervix: Secondary | ICD-10-CM | POA: Diagnosis not present

## 2023-07-29 DIAGNOSIS — Z23 Encounter for immunization: Secondary | ICD-10-CM

## 2023-07-29 MED ORDER — LEVOTHYROXINE SODIUM 175 MCG PO TABS: 175.0000 ug | ORAL_TABLET | Freq: Every day | ORAL | 3 refills | Status: DC

## 2023-07-29 NOTE — Progress Notes (Unsigned)
Subjective:    Patient ID: Jaclyn Brandt, female    DOB: May 26, 1981, 42 y.o.   MRN: [id number]  Jaclyn Brandt is a 42 y.o. female presenting on 07/29/2023 for Annual Exam   HPI   Here for Annual Physical and Lab Review   BMI >27 Diet - balanced diet Lifestyle - staying active, walking   History of Left Breast Cyst She has had prior diagnostic mammo / L breast US done 11/2021, benign now repeat 1 year . See past history HPI   Hypothyroidism Currently doing well. Chronic problem, post-operative hypothyroidism. Last lab 05/2023, TSH mild low 0.2 but high normal Free T4 1.7 She feels fine without concerns. Prior history Dose was reduced from Levothyroxine down to 175 mcg daily She has been stable on current dose Taking Levothyroxine daily and doing well   HYPERLIPIDEMIA: - Reports no concerns. Last lipid panel 05/2023, improved LDL from 160 down to 139 - Not on therapy Lifestyle - Diet: healthier options - Exercise: Walking nightly and active on feet during the day    Additional:   Hemorrhoids, External Reports concern with chronic problem episodic flares external hemorrhoids with pain swelling Tried OTC limited relief  Health Maintenance:  Declines Flu vaccine  Due for Pap Smear, last 2019, history of abnormal, last done in [place], last referral 2023 did not get scheduled Needs new referral     07/29/2023    8:20 AM 05/16/2022    8:17 AM  Depression screen PHQ 2/9  Decreased Interest 0 0  Down, Depressed, Hopeless 0 0  PHQ - 2 Score 0 0  Altered sleeping 0 0  Tired, decreased energy 0 0  Change in appetite 0 0  Feeling bad or failure about yourself  0 0  Trouble concentrating 0 0  Moving slowly or fidgety/restless 0 0  Suicidal thoughts 0 0  PHQ-9 Score 0 0  Difficult doing work/chores Not difficult at all Not difficult at all    Past Medical History:  Diagnosis Date   Hemorrhoids    History of HPV infection    Thyroid disease    Past  Surgical History:  Procedure Laterality Date   HERNIA REPAIR Left 01/25/2015   Primary repair left femoral hernia.   LIPOMA EXCISION Left 10/2004   left calf    THYROIDECTOMY  03/2008   TUBAL LIGATION  11/2013   Social History   Socioeconomic History   Marital status: Married    Spouse name: Not on file   Number of children: Not on file   Years of education: Not on file   Highest education level: Not on file  Occupational History   Not on file  Tobacco Use   Smoking status: Former    Types: Cigarettes   Smokeless tobacco: Never  Substance and Sexual Activity   Alcohol use: No    Alcohol/week: 0.0 standard drinks of alcohol   Drug use: No   Sexual activity: Not on file  Other Topics Concern   Not on file  Social History Narrative   Married   Has 4 children, (3 boys 7 years and 14 months), 65 girl- 41 years old   Live in [place]   Enjoys going out with her husband, hiking.   Social Determinants of Health   Financial Resource Strain: Not on file  Food Insecurity: Not on file  Transportation Needs: Not on file  Physical Activity: Not on file  Stress: Not on file  Social Connections: Not on file  Intimate Partner Violence: Not on file   Family History  Problem Relation Age of Onset   Hyperlipidemia Father    Bipolar disorder Maternal Grandfather    Breast cancer Paternal Grandmother        ? age   Cancer Paternal Grandmother        breast   Heart disease Paternal Grandfather        great aunt/breast   Diabetes Neg Hx    No current outpatient medications on file prior to visit.   No current facility-administered medications on file prior to visit.    Review of Systems  Constitutional:  Negative for activity change, appetite change, chills, diaphoresis, fatigue and fever.  HENT:  Negative for congestion and hearing loss.   Eyes:  Negative for visual disturbance.  Respiratory:  Negative for cough, chest tightness, shortness of breath and wheezing.    Cardiovascular:  Negative for chest pain, palpitations and leg swelling.  Gastrointestinal:  Negative for abdominal pain, constipation, diarrhea, nausea and vomiting.  Genitourinary:  Negative for dysuria, frequency and hematuria.  Musculoskeletal:  Negative for arthralgias and neck pain.  Skin:  Negative for rash.  Neurological:  Negative for dizziness, weakness, light-headedness, numbness and headaches.  Hematological:  Negative for adenopathy.  Psychiatric/Behavioral:  Negative for behavioral problems, dysphoric mood and sleep disturbance.    Per HPI unless specifically indicated above      Objective:    BP 106/68   Pulse 80   Ht 5' 6.25" (1.683 m)   Wt 173 lb (78.5 kg)   SpO2 100%   BMI 27.71 kg/m   Wt Readings from Last 3 Encounters:  07/29/23 173 lb (78.5 kg)  05/16/22 170 lb 3.2 oz (77.2 kg)  11/08/21 168 lb 6.4 oz (76.4 kg)    Physical Exam Vitals and nursing note reviewed.  Constitutional:      General: She is not in acute distress.    Appearance: She is well-developed. She is not diaphoretic.     Comments: Well-appearing, comfortable, cooperative  HENT:     Head: Normocephalic and atraumatic.     Right Ear: Tympanic membrane, ear canal and external ear normal. There is no impacted cerumen.     Left Ear: Tympanic membrane, ear canal and external ear normal. There is no impacted cerumen.  Eyes:     General:        Right eye: No discharge.        Left eye: No discharge.     Conjunctiva/sclera: Conjunctivae normal.     Pupils: Pupils are equal, round, and reactive to light.  Neck:     Thyroid: No thyromegaly.     Vascular: No carotid bruit.  Cardiovascular:     Rate and Rhythm: Normal rate and regular rhythm.     Pulses: Normal pulses.     Heart sounds: Normal heart sounds. No murmur heard. Pulmonary:     Effort: Pulmonary effort is normal. No respiratory distress.     Breath sounds: Normal breath sounds. No wheezing or rales.  Abdominal:     General:  Bowel sounds are normal. There is no distension.     Palpations: Abdomen is soft. There is no mass.     Tenderness: There is no abdominal tenderness.  Musculoskeletal:        General: No tenderness. Normal range of motion.     Cervical back: Normal range of motion and neck supple.     Right lower leg: No edema.  Left lower leg: No edema.     Comments: Upper / Lower Extremities: - Normal muscle tone, strength bilateral upper extremities 5/5, lower extremities 5/5  Lymphadenopathy:     Cervical: No cervical adenopathy.  Skin:    General: Skin is warm and dry.     Findings: No erythema or rash.  Neurological:     Mental Status: She is alert and oriented to person, place, and time.     Comments: Distal sensation intact to light touch all extremities  Psychiatric:        Mood and Affect: Mood normal.        Behavior: Behavior normal.        Thought Content: Thought content normal.     Comments: Well groomed, good eye contact, normal speech and thoughts    Results for orders placed or performed in visit on 06/11/23  COMPLETE METABOLIC PANEL WITH GFR  Result Value Ref Range   Glucose, Bld 85 65 - 99 mg/dL   BUN 12 7 - 25 mg/dL   Creat 4.09 8.11 - 9.14 mg/dL   eGFR 782 > OR = 60 NF/AOZ/3.08M5   BUN/Creatinine Ratio SEE NOTE: 6 - 22 (calc)   Sodium 139 135 - 146 mmol/L   Potassium 3.9 3.5 - 5.3 mmol/L   Chloride 102 98 - 110 mmol/L   CO2 28 20 - 32 mmol/L   Calcium 9.1 8.6 - 10.2 mg/dL   Total Protein 6.9 6.1 - 8.1 g/dL   Albumin 4.5 3.6 - 5.1 g/dL   Globulin 2.4 1.9 - 3.7 g/dL (calc)   AG Ratio 1.9 1.0 - 2.5 (calc)   Total Bilirubin 0.8 0.2 - 1.2 mg/dL   Alkaline phosphatase (APISO) 61 31 - 125 U/L   AST 15 10 - 30 U/L   ALT 15 6 - 29 U/L  CBC with Differential/Platelet  Result Value Ref Range   WBC 5.1 3.8 - 10.8 Thousand/uL   RBC 4.50 3.80 - 5.10 Million/uL   Hemoglobin 13.5 11.7 - 15.5 g/dL   HCT 78.4 69.6 - 29.5 %   MCV 91.1 80.0 - 100.0 fL   MCH 30.0 27.0 - 33.0  pg   MCHC 32.9 32.0 - 36.0 g/dL   RDW 28.4 13.2 - 44.0 %   Platelets 313 140 - 400 Thousand/uL   MPV 10.2 7.5 - 12.5 fL   Neutro Abs 3,499 1,500 - 7,800 cells/uL   Lymphs Abs 1,193 850 - 3,900 cells/uL   Absolute Monocytes 326 200 - 950 cells/uL   Eosinophils Absolute 61 15 - 500 cells/uL   Basophils Absolute 20 0 - 200 cells/uL   Neutrophils Relative % 68.6 %   Total Lymphocyte 23.4 %   Monocytes Relative 6.4 %   Eosinophils Relative 1.2 %   Basophils Relative 0.4 %  Lipid panel  Result Value Ref Range   Cholesterol 227 (H) <200 mg/dL   HDL 58 > OR = 50 mg/dL   Triglycerides 102 (H) <150 mg/dL   LDL Cholesterol (Calc) 139 (H) mg/dL (calc)   Total CHOL/HDL Ratio 3.9 <5.0 (calc)   Non-HDL Cholesterol (Calc) 169 (H) <130 mg/dL (calc)  Hemoglobin V2Z  Result Value Ref Range   Hgb A1c MFr Bld 5.0 <5.7 % of total Hgb   Mean Plasma Glucose 97 mg/dL   eAG (mmol/L) 5.4 mmol/L  TSH  Result Value Ref Range   TSH 0.23 (L) mIU/L  T4, free  Result Value Ref Range   Free T4 1.7 0.8 -  1.8 ng/dL      Assessment & Plan:   Problem List Items Addressed This Visit     Hypothyroidism    Stable chronic problem Labs show slightly low TSH 0.23 and normal T4 but high end of normal Agreed to keep current dose. Asymptomatic Controlled on levothyroxine 175mg  daily Add refills      Relevant Medications   levothyroxine (SYNTHROID) 175 MCG tablet   Other Visit Diagnoses     Annual physical exam    -  Primary   Cervical cancer screening       Relevant Orders   Ambulatory referral to Obstetrics / Gynecology   Encounter for screening mammogram for malignant neoplasm of breast       Relevant Orders   MM 3D SCREENING MAMMOGRAM BILATERAL BREAST   External hemorrhoids       Relevant Orders   Ambulatory referral to General Surgery       Updated Health Maintenance information Reviewed recent lab results with patient Encouraged improvement to lifestyle with diet and exercise Goal of  weight loss  Ordered Mammogram - to be scheduled at [facility]  Referral to [facility] for establish care routine follow up pap smear cervical cancer screening, previously paps done in [place] prior to moving back to [place], last one approx 2019, has had chronic history of "abnormal pap result", requesting GYN for repeat testing, she has required colposcopy / biopsy in the past. Last referral 2023 did not schedule.  - Note will need to be [facility] to [facility] due to availability  Additional topic She would like consult with General Surgery for eval of External Hemorrhoids, with chronic recurrent flares. Referral submitted  Orders Placed This Encounter  Procedures   MM 3D SCREENING MAMMOGRAM BILATERAL BREAST    Standing Status:   Future    Standing Expiration Date:   07/28/2024    Order Specific Question:   Reason for Exam (SYMPTOM  OR DIAGNOSIS REQUIRED)    Answer:   Screening bilateral 3D Mammogram Tomo    Order Specific Question:   Preferred imaging location?    Answer:   [facility]   Ambulatory referral to Obstetrics / Gynecology    Referral Priority:   Routine    Referral Type:   Consultation    Referral Reason:   Specialty Services Required    Requested Specialty:   Obstetrics and Gynecology    Number of Visits Requested:   1   Ambulatory referral to General Surgery    Referral Priority:   Routine    Referral Type:   Surgical    Referral Reason:   Specialty Services Required    Requested Specialty:   General Surgery    Number of Visits Requested:   1      Meds ordered this encounter  Medications   levothyroxine (SYNTHROID) 175 MCG tablet    Sig: Take 1 tablet (175 mcg total) by mouth daily before breakfast.    Dispense:  90 tablet    Refill:  3      Follow up plan: Return in about 1 year (around 07/28/2024) for 1 year fasting lab only then 1 week later Annual Physical.  Saralyn Pilar, DO [facility] 07/29/2023, 8:45 AM

## 2023-07-29 NOTE — Patient Instructions (Addendum)
Thank you for coming to the office today.  For external hemorrhoids  [facility] [address] [place],  [place]  [address] Phone: [phone]   Referral in, stay tuned for apt, if not heard back within 2 weeks, you can contact them. If long wait time, let me know we can switch  [facility] at [facility] [address] [place],  [place]  [address] Main: [phone]  For Mammogram screening for breast cancer   Call the Imaging Center below anytime to schedule your own appointment now that order has been placed.  [facility] at [facility] [address] [place], [place] [address] Phone: [phone]  DUE for FASTING BLOOD WORK (no food or drink after midnight before the lab appointment, only water or coffee without cream/sugar on the morning of)  SCHEDULE "Lab Only" visit in the morning at the clinic for lab draw in 1 YEAR  - Make sure Lab Only appointment is at about 1 week before your next appointment, so that results will be available  For Lab Results, once available within 2-3 days of blood draw, you can can log in to MyChart online to view your results and a brief explanation. Also, we can discuss results at next follow-up visit.    Please schedule a Follow-up Appointment to: Return in about 1 year (around 07/28/2024) for 1 year fasting lab only then 1 week later Annual Physical.  If you have any other questions or concerns, please feel free to call the office or send a message through MyChart. You may also schedule an earlier appointment if necessary.  Additionally, you may be receiving a survey about your experience at our office within a few days to 1 week by e-mail or mail. We value your feedback.  Saralyn Pilar, DO [facility], [place]

## 2023-07-30 NOTE — Assessment & Plan Note (Signed)
Stable chronic problem Labs show slightly low TSH 0.23 and normal T4 but high end of normal Agreed to keep current dose. Asymptomatic Controlled on levothyroxine 175mg  daily Add refills

## 2024-05-18 ENCOUNTER — Other Ambulatory Visit: Payer: Self-pay | Admitting: Family Medicine

## 2024-05-18 DIAGNOSIS — Z131 Encounter for screening for diabetes mellitus: Secondary | ICD-10-CM

## 2024-05-18 DIAGNOSIS — E7800 Pure hypercholesterolemia, unspecified: Secondary | ICD-10-CM

## 2024-05-18 DIAGNOSIS — E890 Postprocedural hypothyroidism: Secondary | ICD-10-CM

## 2024-05-18 DIAGNOSIS — Z0000 Encounter for general adult medical examination without abnormal findings: Secondary | ICD-10-CM

## 2024-05-19 ENCOUNTER — Other Ambulatory Visit: Payer: Self-pay | Admitting: Family Medicine

## 2024-05-19 ENCOUNTER — Other Ambulatory Visit: Payer: Self-pay

## 2024-05-19 DIAGNOSIS — Z0000 Encounter for general adult medical examination without abnormal findings: Secondary | ICD-10-CM

## 2024-05-19 DIAGNOSIS — E7800 Pure hypercholesterolemia, unspecified: Secondary | ICD-10-CM

## 2024-05-19 DIAGNOSIS — E890 Postprocedural hypothyroidism: Secondary | ICD-10-CM

## 2024-05-19 DIAGNOSIS — Z131 Encounter for screening for diabetes mellitus: Secondary | ICD-10-CM

## 2024-05-20 ENCOUNTER — Ambulatory Visit: Payer: Self-pay | Admitting: Family Medicine

## 2024-05-20 ENCOUNTER — Other Ambulatory Visit: Payer: Self-pay | Admitting: Family Medicine

## 2024-05-20 DIAGNOSIS — E890 Postprocedural hypothyroidism: Secondary | ICD-10-CM

## 2024-05-20 LAB — COMPREHENSIVE METABOLIC PANEL WITH GFR
AG Ratio: 1.8 (calc) (ref 1.0–2.5)
ALT: 25 U/L (ref 6–29)
AST: 17 U/L (ref 10–30)
Albumin: 4.4 g/dL (ref 3.6–5.1)
Alkaline phosphatase (APISO): 73 U/L (ref 31–125)
BUN: 11 mg/dL (ref 7–25)
CO2: 27 mmol/L (ref 20–32)
Calcium: 8.8 mg/dL (ref 8.6–10.2)
Chloride: 103 mmol/L (ref 98–110)
Creat: 0.54 mg/dL (ref 0.50–0.99)
Globulin: 2.4 g/dL (ref 1.9–3.7)
Glucose, Bld: 91 mg/dL (ref 65–99)
Potassium: 4.2 mmol/L (ref 3.5–5.3)
Sodium: 137 mmol/L (ref 135–146)
Total Bilirubin: 0.6 mg/dL (ref 0.2–1.2)
Total Protein: 6.8 g/dL (ref 6.1–8.1)
eGFR: 119 mL/min/{1.73_m2} (ref >=60)

## 2024-05-20 LAB — CBC WITH DIFFERENTIAL/PLATELET
Absolute Lymphocytes: 870 {cells}/uL (ref 850–3900)
Absolute Monocytes: 222 {cells}/uL (ref 200–950)
Basophils Absolute: 12 {cells}/uL (ref 0–200)
Basophils Relative: 0.3 %
Eosinophils Absolute: 62 {cells}/uL (ref 15–500)
Eosinophils Relative: 1.6 %
HCT: 41.3 % (ref 35.0–45.0)
Hemoglobin: 13.4 g/dL (ref 11.7–15.5)
MCH: 29.6 pg (ref 27.0–33.0)
MCHC: 32.4 g/dL (ref 32.0–36.0)
MCV: 91.2 fL (ref 80.0–100.0)
MPV: 10.4 fL (ref 7.5–12.5)
Monocytes Relative: 5.7 %
Neutro Abs: 2734 {cells}/uL (ref 1500–7800)
Neutrophils Relative %: 70.1 %
Platelets: 296 10*3/uL (ref 140–400)
RBC: 4.53 10*6/uL (ref 3.80–5.10)
RDW: 12 % (ref 11.0–15.0)
Total Lymphocyte: 22.3 %
WBC: 3.9 10*3/uL (ref 3.8–10.8)

## 2024-05-20 LAB — HEMOGLOBIN A1C
Hgb A1c MFr Bld: 5 % (ref <5.7)
Mean Plasma Glucose: 97 mg/dL
eAG (mmol/L): 5.4 mmol/L

## 2024-05-20 LAB — LIPID PANEL
Cholesterol: 221 mg/dL — ABNORMAL HIGH (ref <200)
HDL: 53 mg/dL (ref >=50)
LDL Cholesterol (Calc): 145 mg/dL — ABNORMAL HIGH
Non-HDL Cholesterol (Calc): 168 mg/dL — ABNORMAL HIGH (ref <130)
Total CHOL/HDL Ratio: 4.2 (calc) (ref <5.0)
Triglycerides: 117 mg/dL (ref <150)

## 2024-05-20 LAB — TSH: TSH: 0.05 m[IU]/L — ABNORMAL LOW

## 2024-05-20 LAB — T4, FREE: Free T4: 1.8 ng/dL (ref 0.8–1.8)

## 2024-05-20 MED ORDER — LEVOTHYROXINE SODIUM 175 MCG PO TABS: 175.0000 ug | ORAL_TABLET | Freq: Every day | ORAL | 0 refills | Status: DC

## 2024-05-24 ENCOUNTER — Other Ambulatory Visit: Payer: Self-pay

## 2024-05-24 DIAGNOSIS — E890 Postprocedural hypothyroidism: Secondary | ICD-10-CM

## 2024-05-24 MED ORDER — LEVOTHYROXINE SODIUM 175 MCG PO TABS: 175.0000 ug | ORAL_TABLET | Freq: Every day | ORAL | 0 refills | Status: DC

## 2024-06-29 ENCOUNTER — Telehealth: Payer: Self-pay | Admitting: Family Medicine

## 2024-06-29 NOTE — Telephone Encounter (Signed)
 Prescription Request  06/29/2024  LOV: 07/29/2023  What is the name of the medication or equipment? LEVOTHYROXINE   Have you contacted your pharmacy to request a refill? No   Which pharmacy would you like this sent to?  [facility] [address] [place] - [address] [place] [address] Phone: [phone] Fax: [phone]  [id number] - [place] [facility] - [address] [place] [address] Phone: [phone] Fax: [phone]    Patient notified that their request is being sent to the clinical staff for review and that they should receive a response within 2 business days.   Please advise at Mobile [phone] (mobile)

## 2024-06-29 NOTE — Telephone Encounter (Signed)
 Left message for patient to return call OK  to advise  this prescription was sent in on [facility]/24/24 to [facility].

## 2024-07-15 ENCOUNTER — Other Ambulatory Visit: Payer: Self-pay

## 2024-07-23 ENCOUNTER — Other Ambulatory Visit: Payer: Self-pay | Admitting: Family Medicine

## 2024-07-23 DIAGNOSIS — E890 Postprocedural hypothyroidism: Secondary | ICD-10-CM

## 2024-07-23 NOTE — Telephone Encounter (Signed)
 Requested Prescriptions  Refused Prescriptions Disp Refills   SYNTHROID  175 MCG tablet [Pharmacy Med Name: SYNTHROID  175 MCG TABLET] 90 tablet 1    Sig: TAKE 1 TABLET BY MOUTH DAILY BEFORE BREAKFAST.     Endocrinology:  Hypothyroid Agents Failed - 07/23/2024  2:29 PM      Failed - TSH in normal range and within 360 days    TSH  Date Value Ref Range Status  05/19/2024 0.05 (L) mIU/L Final    Comment:              Reference Range .           > or = 20 Years  0.40-4.50 .                Pregnancy Ranges           First trimester    0.26-2.66           Second trimester   0.55-2.73           Third trimester    0.43-2.91          Failed - Valid encounter within last 12 months    Recent Outpatient Visits   None     Future Appointments             In 1 week Edman, Marsa PARAS, DO [place] [facility], Main [address]

## 2024-08-03 ENCOUNTER — Ambulatory Visit: Payer: Self-pay | Admitting: Family Medicine

## 2024-08-03 ENCOUNTER — Other Ambulatory Visit: Payer: Self-pay | Admitting: Family Medicine

## 2024-08-03 ENCOUNTER — Encounter: Payer: Self-pay | Admitting: Family Medicine

## 2024-08-03 VITALS — BP 118/78 | HR 83 | Ht 66.25 in | Wt 175.0 lb

## 2024-08-03 DIAGNOSIS — Z114 Encounter for screening for human immunodeficiency virus [HIV]: Secondary | ICD-10-CM

## 2024-08-03 DIAGNOSIS — Z0000 Encounter for general adult medical examination without abnormal findings: Secondary | ICD-10-CM

## 2024-08-03 DIAGNOSIS — Z1231 Encounter for screening mammogram for malignant neoplasm of breast: Secondary | ICD-10-CM

## 2024-08-03 DIAGNOSIS — E890 Postprocedural hypothyroidism: Secondary | ICD-10-CM

## 2024-08-03 DIAGNOSIS — N6011 Diffuse cystic mastopathy of right breast: Secondary | ICD-10-CM

## 2024-08-03 DIAGNOSIS — Z131 Encounter for screening for diabetes mellitus: Secondary | ICD-10-CM

## 2024-08-03 DIAGNOSIS — E7800 Pure hypercholesterolemia, unspecified: Secondary | ICD-10-CM

## 2024-08-03 DIAGNOSIS — Z124 Encounter for screening for malignant neoplasm of cervix: Secondary | ICD-10-CM

## 2024-08-03 DIAGNOSIS — Z1159 Encounter for screening for other viral diseases: Secondary | ICD-10-CM

## 2024-08-03 MED ORDER — LEVOTHYROXINE SODIUM 175 MCG PO TABS: 175.0000 ug | ORAL_TABLET | Freq: Every day | ORAL | 3 refills | Status: AC

## 2024-08-03 NOTE — Progress Notes (Signed)
 Subjective:    Patient ID: Jaclyn Brandt, female    DOB: [birth year], 43 y.o.   MRN: [id number]  Jaclyn Brandt is a 43 y.o. female presenting on 08/03/2024 for Annual Exam   HPI  Discussed the use of AI scribe software for clinical note transcription with the patient, who gave verbal consent to proceed.  History of Present Illness   Jaclyn Brandt is a 43 year old female who presents for an annual physical exam.   BMI >28 Diet - balanced diet Lifestyle - staying active, walking   History of Left Breast Cyst She has had prior diagnostic mammo / L breast US  done 11/2021, benign now repeat mammogram is overdue.   Hypothyroidism Currently doing well. Chronic problem, post-operative hypothyroidism. Last lab 05/2024, TSH mild low 0.05 but high normal Free T4 1.8 She feels fine without concerns from thyroid  Prior history Dose was reduced from Levothyroxine  200mcg down to 175 mcg daily She has been stable on current dose Taking Levothyroxine  175mcg daily and doing well    HYPERLIPIDEMIA: - Reports no concerns. Last lipid panel 05/2024, improved LDL from 160 down to 145, stable. - Not on therapy - Family history of hypercholesterolemia and heart disease    Additional:   URI / Sinusitis Early head cold symptoms, kids returned to school No fever or new concerns Using OTC med options now    Health Maintenance:   Declines Flu vaccine, COVID   Due for mammogram + Pap smear, referral     08/03/2024    8:44 AM 07/29/2023    8:20 AM 05/16/2022    8:17 AM  Depression screen PHQ 2/9  Decreased Interest 0 0 0  Down, Depressed, Hopeless 0 0 0  PHQ - 2 Score 0 0 0  Altered sleeping  0 0  Tired, decreased energy  0 0  Change in appetite  0 0  Feeling bad or failure about yourself   0 0  Trouble concentrating  0 0  Moving slowly or fidgety/restless  0 0  Suicidal thoughts  0 0  PHQ-9 Score  0 0  Difficult doing work/chores  Not difficult at all Not difficult at all        08/03/2024    8:12 AM 07/29/2023    8:21 AM 05/16/2022    8:18 AM  GAD 7 : Generalized Anxiety Score  Nervous, Anxious, on Edge 0 0 0  Control/stop worrying 0 0 0  Worry too much - different things 0 0 0  Trouble relaxing 0 0 0  Restless 0 0 0  Easily annoyed or irritable 0 0 0  Afraid - awful might happen 0 0 0  Total GAD 7 Score 0 0 0  Anxiety Difficulty   Not difficult at all     Past Medical History:  Diagnosis Date   Hemorrhoids    History of HPV infection    Thyroid  disease    Past Surgical History:  Procedure Laterality Date   HERNIA REPAIR Left 01/25/2015   Primary repair left femoral hernia.   LIPOMA EXCISION Left 10/2004   left calf    THYROIDECTOMY  03/2008   TUBAL LIGATION  11/2013   Social History   Socioeconomic History   Marital status: Married    Spouse name: Not on file   Number of children: Not on file   Years of education: Not on file   Highest education level: Not on file  Occupational History   Not on file  Tobacco Use  Smoking status: Former    Types: Cigarettes   Smokeless tobacco: Never  Substance and Sexual Activity   Alcohol use: No    Alcohol/week: 0.0 standard drinks of alcohol   Drug use: No   Sexual activity: Not on file  Other Topics Concern   Not on file  Social History Narrative   Married   Has 4 children, (3 boys 7 years and 14 months), 103 girl- 42 years old   Live in [place]   Enjoys going out with her husband, hiking.   Social Drivers of Corporate investment banker Strain: Not on file  Food Insecurity: Not on file  Transportation Needs: Not on file  Physical Activity: Not on file  Stress: Not on file  Social Connections: Not on file  Intimate Partner Violence: Not on file   Family History  Problem Relation Age of Onset   Hyperlipidemia Father    Bipolar disorder Maternal Grandfather    Breast cancer Paternal Grandmother        ? age   Cancer Paternal Grandmother        breast   Heart disease Paternal  Grandfather        great aunt/breast   Diabetes Neg Hx    No current outpatient medications on file prior to visit.   No current facility-administered medications on file prior to visit.    Review of Systems  Constitutional:  Negative for activity change, appetite change, chills, diaphoresis, fatigue and fever.  HENT:  Negative for congestion and hearing loss.   Eyes:  Negative for visual disturbance.  Respiratory:  Negative for cough, chest tightness, shortness of breath and wheezing.   Cardiovascular:  Negative for chest pain, palpitations and leg swelling.  Gastrointestinal:  Negative for abdominal pain, constipation, diarrhea, nausea and vomiting.  Genitourinary:  Negative for dysuria, frequency and hematuria.  Musculoskeletal:  Negative for arthralgias and neck pain.  Skin:  Negative for rash.  Neurological:  Negative for dizziness, weakness, light-headedness, numbness and headaches.  Hematological:  Negative for adenopathy.  Psychiatric/Behavioral:  Negative for behavioral problems, dysphoric mood and sleep disturbance.    Per HPI unless specifically indicated above     Objective:    BP 118/78 (BP Location: Left Arm, Patient Position: Sitting, Cuff Size: Normal)   Pulse 83   Ht 5' 6.25 (1.683 m)   Wt 175 lb (79.4 kg)   SpO2 97%   BMI 28.03 kg/m   Wt Readings from Last 3 Encounters:  08/03/24 175 lb (79.4 kg)  07/29/23 173 lb (78.5 kg)  05/16/22 170 lb 3.2 oz (77.2 kg)    Physical Exam Vitals and nursing note reviewed.  Constitutional:      General: She is not in acute distress.    Appearance: She is well-developed. She is not diaphoretic.     Comments: Well-appearing, comfortable, cooperative  HENT:     Head: Normocephalic and atraumatic.     Right Ear: Tympanic membrane, ear canal and external ear normal. There is no impacted cerumen.     Left Ear: Tympanic membrane, ear canal and external ear normal. There is no impacted cerumen.  Eyes:     General:         Right eye: No discharge.        Left eye: No discharge.     Conjunctiva/sclera: Conjunctivae normal.     Pupils: Pupils are equal, round, and reactive to light.  Neck:     Thyroid : No thyromegaly.  Vascular: No carotid bruit.  Cardiovascular:     Rate and Rhythm: Normal rate and regular rhythm.     Pulses: Normal pulses.     Heart sounds: Normal heart sounds. No murmur heard. Pulmonary:     Effort: Pulmonary effort is normal. No respiratory distress.     Breath sounds: Normal breath sounds. No wheezing or rales.  Abdominal:     General: Bowel sounds are normal. There is no distension.     Palpations: Abdomen is soft. There is no mass.     Tenderness: There is no abdominal tenderness.  Musculoskeletal:        General: No tenderness. Normal range of motion.     Cervical back: Normal range of motion and neck supple.     Right lower leg: No edema.     Left lower leg: No edema.     Comments: Upper / Lower Extremities: - Normal muscle tone, strength bilateral upper extremities 5/5, lower extremities 5/5  Lymphadenopathy:     Cervical: No cervical adenopathy.  Skin:    General: Skin is warm and dry.     Findings: No erythema or rash.  Neurological:     Mental Status: She is alert and oriented to person, place, and time.     Comments: Distal sensation intact to light touch all extremities  Psychiatric:        Mood and Affect: Mood normal.        Behavior: Behavior normal.        Thought Content: Thought content normal.     Comments: Well groomed, good eye contact, normal speech and thoughts     Results for orders placed or performed in visit on 05/19/24  T4, free   Collection Time: 05/19/24  9:55 AM  Result Value Ref Range   Free T4 1.8 0.8 - 1.8 ng/dL  Comprehensive metabolic panel with GFR   Collection Time: 05/19/24  9:55 AM  Result Value Ref Range   Glucose, Bld 91 65 - 99 mg/dL   BUN 11 7 - 25 mg/dL   Creat 9.45 9.49 - 9.00 mg/dL   eGFR 880 > OR = 60 fO/fpw/8.26f7    BUN/Creatinine Ratio SEE NOTE: 6 - 22 (calc)   Sodium 137 135 - 146 mmol/L   Potassium 4.2 3.5 - 5.3 mmol/L   Chloride 103 98 - 110 mmol/L   CO2 27 20 - 32 mmol/L   Calcium 8.8 8.6 - 10.2 mg/dL   Total Protein 6.8 6.1 - 8.1 g/dL   Albumin 4.4 3.6 - 5.1 g/dL   Globulin 2.4 1.9 - 3.7 g/dL (calc)   AG Ratio 1.8 1.0 - 2.5 (calc)   Total Bilirubin 0.6 0.2 - 1.2 mg/dL   Alkaline phosphatase (APISO) 73 31 - 125 U/L   AST 17 10 - 30 U/L   ALT 25 6 - 29 U/L  CBC with Differential/Platelet   Collection Time: 05/19/24  9:55 AM  Result Value Ref Range   WBC 3.9 3.8 - 10.8 Thousand/uL   RBC 4.53 3.80 - 5.10 Million/uL   Hemoglobin 13.4 11.7 - 15.5 g/dL   HCT 58.6 64.9 - 54.9 %   MCV 91.2 80.0 - 100.0 fL   MCH 29.6 27.0 - 33.0 pg   MCHC 32.4 32.0 - 36.0 g/dL   RDW 87.9 88.9 - 84.9 %   Platelets 296 140 - 400 Thousand/uL   MPV 10.4 7.5 - 12.5 fL   Neutro Abs 2,734 1,500 - 7,800 cells/uL  Absolute Lymphocytes 870 850 - 3,900 cells/uL   Absolute Monocytes 222 200 - 950 cells/uL   Eosinophils Absolute 62 15 - 500 cells/uL   Basophils Absolute 12 0 - 200 cells/uL   Neutrophils Relative % 70.1 %   Total Lymphocyte 22.3 %   Monocytes Relative 5.7 %   Eosinophils Relative 1.6 %   Basophils Relative 0.3 %  Hemoglobin A1c   Collection Time: 05/19/24  9:55 AM  Result Value Ref Range   Hgb A1c MFr Bld 5.0 <5.7 %   Mean Plasma Glucose 97 mg/dL   eAG (mmol/L) 5.4 mmol/L  Lipid panel   Collection Time: 05/19/24  9:55 AM  Result Value Ref Range   Cholesterol 221 (H) <200 mg/dL   HDL 53 > OR = 50 mg/dL   Triglycerides 882 <849 mg/dL   LDL Cholesterol (Calc) 145 (H) mg/dL (calc)   Total CHOL/HDL Ratio 4.2 <5.0 (calc)   Non-HDL Cholesterol (Calc) 168 (H) <130 mg/dL (calc)  TSH   Collection Time: 05/19/24  9:55 AM  Result Value Ref Range   TSH 0.05 (L) mIU/L      Assessment & Plan:   Problem List Items Addressed This Visit     Hypothyroidism   Relevant Medications   levothyroxine   (SYNTHROID ) 175 MCG tablet   Other Visit Diagnoses       Annual physical exam    -  Primary     Cervical cancer screening       Relevant Orders   Ambulatory referral to Obstetrics / Gynecology     Encounter for screening mammogram for malignant neoplasm of breast       Relevant Orders   Ambulatory referral to Obstetrics / Gynecology     Fibrocystic disease of right breast       Relevant Orders   Ambulatory referral to Obstetrics / Gynecology        Updated Health Maintenance information Reviewed recent lab results with patient Encouraged improvement to lifestyle with diet and exercise Goal of weight loss  Adult Wellness Visit Annual physical examination with no acute concerns. Declined flu shot, COVID booster, and tetanus booster. Immunization records for HPV and Hepatitis B need verification. Consider future HIV and Hepatitis C screenings. Overdue for breast and cervical cancer screening. - Verify immunization records for HPV and Hepatitis B. - Consider future HIV and Hepatitis C screenings. - Referral to GYN for Pap smear and mammogram.  acute upper respiratory infection Symptoms of early viral head cold. No significant intervention required. - Use over-the-counter cold medications as needed. - Monitor symptoms; consider further evaluation if she worsens or persists.  Hypothyroidism S/p Thyroidectomy Controlled On levothyroxine  175 mcg. TSH is low, T4 at upper limit of normal. Maintain current dose unless T4 exceeds 1.8. - Continue levothyroxine  175 mcg daily. - Monitor T4 levels; adjust dose if T4 exceeds 1.8. - Order 1-year supply of levothyroxine  with refills.  Hyperlipidemia LDL cholesterol at 145 mg/dL, slightly elevated. No current medication. Family history of high cholesterol and heart disease. Low cardiovascular risk at 0.7% over 10 years. Focus on lifestyle modifications. - Implement lifestyle modifications including diet and exercise. - Consider future CT  heart scan if risk factors increase.  Overdue for breast and cervical cancer screening Last mammogram in January 2023. No current GYN. Overdue for Pap smear and mammogram. Previous mammogram showed fibrocystic changes. - Referral to GYN for Pap smear and mammogram. - Discuss potential for advanced imaging if indicated by GYN.  Orders Placed This Encounter  Procedures   Ambulatory referral to Obstetrics / Gynecology    Referral Priority:   Routine    Referral Type:   Consultation    Referral Reason:   Specialty Services Required    Requested Specialty:   Obstetrics and Gynecology    Number of Visits Requested:   1    Meds ordered this encounter  Medications   levothyroxine  (SYNTHROID ) 175 MCG tablet    Sig: Take 1 tablet (175 mcg total) by mouth daily before breakfast.    Dispense:  90 tablet    Refill:  3    Follow up plan: Return for 1 year fasting lab > 1 week later Annual Physical.  Future labs 08/03/2025   Marsa Officer, DO [facility] 08/03/2024, 8:13 AM

## 2024-08-03 NOTE — Patient Instructions (Addendum)
 Thank you for coming to the office today.  Goal for reducing cholesterol LDL in the diet.  Check for immunization vaccine record.  Referral for pap smear / mammogram discussions or other imaging.  [facility] [address] [place],  [place]  [address] Main: [phone]  FUTURE CONSIDERATION Coronary Calcium Score Cardiac CT Scan. This is a screening test for patients aged 56-50+ with cardiovascular risk factors or who are healthy but would be interested in Cardiovascular Screening for heart disease. Even if there is a family history of heart disease, this imaging can be useful. Typically it can be done every 5+ years or at a different timeline we agree on  The scan will look at the chest and mainly focus on the heart and identify early signs of calcium build up or blockages within the heart arteries. It is not 100% accurate for identifying blockages or heart disease, but it is useful to help us  predict who may have some early changes or be at risk in the future for a heart attack or cardiovascular problem.  The results are reviewed by a Cardiologist and they will document the results. It should become available on MyChart. Typically the results are divided into percentiles based on other patients of the same demographic and age. So it will compare your risk to others similar to you. If you have a higher score >99 or higher percentile >75%tile, it is recommended to consider Statin cholesterol therapy and or referral to Cardiologist. I will try to help explain your results and if we have questions we can contact the Cardiologist.  You will be contacted for scheduling. Usually it is done at any imaging facility through [facility], [facility] or [facility].  The cost is $99 flat fee total and it does not go through insurance, so no authorization is required.    Please schedule a Follow-up Appointment to: Return for 1 year  fasting lab > 1 week later Annual Physical.  If you have any other questions or concerns, please feel free to call the office or send a message through MyChart. You may also schedule an earlier appointment if necessary.  Additionally, you may be receiving a survey about your experience at our office within a few days to 1 week by e-mail or mail. We value your feedback.  Marsa Officer, DO [facility], [place]

## 2025-08-03 ENCOUNTER — Other Ambulatory Visit: Payer: Self-pay

## 2025-08-10 ENCOUNTER — Encounter: Payer: Self-pay | Admitting: Family Medicine
